# Patient Record
Sex: Male | Born: 1975 | Race: Black or African American | Hispanic: No | Marital: Married | Smoking: Former smoker
Health system: Southern US, Community
[De-identification: ages and names within clinical notes are randomized; demographics above are authoritative.]

## PROBLEM LIST (undated history)

## (undated) DIAGNOSIS — E559 Vitamin D deficiency, unspecified: Secondary | ICD-10-CM

## (undated) DIAGNOSIS — J302 Other seasonal allergic rhinitis: Secondary | ICD-10-CM

## (undated) DIAGNOSIS — R7303 Prediabetes: Secondary | ICD-10-CM

## (undated) DIAGNOSIS — K5010 Crohn's disease of large intestine without complications: Secondary | ICD-10-CM

## (undated) HISTORY — DX: Vitamin D deficiency, unspecified: E55.9

## (undated) HISTORY — PX: NO PAST SURGERIES: SHX2092

## (undated) HISTORY — DX: Crohn's disease of large intestine without complications: K50.10

## (undated) HISTORY — DX: Other seasonal allergic rhinitis: J30.2

## (undated) HISTORY — DX: Prediabetes: R73.03

---

## 2000-10-11 ENCOUNTER — Encounter: Admission: RE | Admit: 2000-10-11 | Discharge: 2000-10-11 | Payer: Self-pay | Admitting: Internal Medicine

## 2000-10-11 ENCOUNTER — Encounter: Payer: Self-pay | Admitting: Internal Medicine

## 2008-07-07 ENCOUNTER — Other Ambulatory Visit (HOSPITAL_COMMUNITY): Admission: RE | Admit: 2008-07-07 | Discharge: 2008-07-23 | Payer: Self-pay | Admitting: Psychiatry

## 2008-07-09 ENCOUNTER — Ambulatory Visit: Payer: Self-pay | Admitting: Psychiatry

## 2013-07-18 DIAGNOSIS — E559 Vitamin D deficiency, unspecified: Secondary | ICD-10-CM | POA: Insufficient documentation

## 2013-07-18 DIAGNOSIS — R7303 Prediabetes: Secondary | ICD-10-CM | POA: Insufficient documentation

## 2013-07-22 ENCOUNTER — Encounter: Payer: Self-pay | Admitting: Internal Medicine

## 2013-08-28 ENCOUNTER — Ambulatory Visit (INDEPENDENT_AMBULATORY_CARE_PROVIDER_SITE_OTHER): Payer: Managed Care, Other (non HMO) | Admitting: Internal Medicine

## 2013-08-28 ENCOUNTER — Encounter: Payer: Self-pay | Admitting: Internal Medicine

## 2013-08-28 VITALS — BP 118/80 | HR 72 | Temp 98.1°F | Resp 18 | Ht 69.5 in | Wt 171.0 lb

## 2013-08-28 DIAGNOSIS — Z0000 Encounter for general adult medical examination without abnormal findings: Secondary | ICD-10-CM

## 2013-08-28 DIAGNOSIS — E349 Endocrine disorder, unspecified: Secondary | ICD-10-CM | POA: Insufficient documentation

## 2013-08-28 DIAGNOSIS — E782 Mixed hyperlipidemia: Secondary | ICD-10-CM | POA: Insufficient documentation

## 2013-08-28 DIAGNOSIS — Z111 Encounter for screening for respiratory tuberculosis: Secondary | ICD-10-CM

## 2013-08-28 DIAGNOSIS — E291 Testicular hypofunction: Secondary | ICD-10-CM

## 2013-08-28 DIAGNOSIS — Z1212 Encounter for screening for malignant neoplasm of rectum: Secondary | ICD-10-CM

## 2013-08-28 DIAGNOSIS — E559 Vitamin D deficiency, unspecified: Secondary | ICD-10-CM

## 2013-08-28 NOTE — Patient Instructions (Signed)

## 2013-08-28 NOTE — Progress Notes (Signed)
Patient ID: John Velez, male   DOB: May 06, 1976, 38 y.o.   MRN: [id number]   Annual Screening Comprehensive Examination   This very nice 38 y.o. SBM presents for complete physical.  Patient has no major health issues. He has felt well with Systems review negative as below. In Feb 2014 his testosterone did test low at 304. He also has PreDiabetes with an elevated A1c 6.1% in Jan 2011 and subsequent AS1c was 5.3% in Jan 2013 and 5.9% in Feb 2014. Unfortunately he has gained 12# weight since last year. Finally, patient has history of Vitamin D Deficiency of 26 in 2010 and  last vitamin D was 11 in Feb 2014. In Feb he also had an elevated LDL chol of 108 with nl total chol 188, Trig 65 and HDL of 39.  Medication Sig  . Cholecalciferol (VITAMIN D PO) Take 8,000 Int'l Units by mouth daily.   No Known Allergies  Past Medical History  Diagnosis Date  . Vitamin D deficiency    Elevated LDL Cholesterol   . Prediabetes     Past Surgical History  Procedure Laterality Date  . No past surgeries      Family History  Problem Relation Age of Onset  . Diabetes Mother   . Cancer Mother     Breast  . Hypertension Father   . Hyperlipidemia Father   . Cancer Father     Prostate  . Cancer Maternal Grandmother     lung  . Cancer Maternal Grandfather     lung    History   Social History  . Marital Status: Married    Spouse Name: N/A    Number of Children: Daughter 68 mo & son DUE in 2 weeks  . Years of Education: N/A   Occupational History  . Works at [employer]  . Smoking status: Never Smoker   . Smokeless tobacco: Not on file  . Alcohol Use: Yes     Comment: ocassional  . Drug Use: No  . Sexual Activity: Active     ROS Constitutional: Denies fever, chills, weight loss/gain, headaches, insomnia, fatigue, night sweats, and change in appetite. Eyes: Denies redness, blurred vision, diplopia, discharge, itchy, watery eyes.  ENT: Denies discharge, congestion,  post nasal drip, epistaxis, sore throat, earache, hearing loss, dental pain, Tinnitus, Vertigo, Sinus pain, snoring.  Cardio: Denies chest pain, palpitations, irregular heartbeat, syncope, dyspnea, diaphoresis, orthopnea, PND, claudication, edema Respiratory: denies cough, dyspnea, DOE, pleurisy, hoarseness, laryngitis, wheezing.  Gastrointestinal: Denies dysphagia, heartburn, reflux, water brash, pain, cramps, nausea, vomiting, bloating, diarrhea, constipation, hematemesis, melena, hematochezia, jaundice, hemorrhoids Genitourinary: Denies dysuria, frequency, urgency, nocturia, hesitancy, discharge, hematuria, flank pain Breast: Breast lumps, nipple discharge, bleeding.  Musculoskeletal: Denies arthralgia, myalgia, stiffness, Jt. Swelling, pain, limp, and strain/sprain. Skin: Denies pruritis, rash, hives, warts, acne, eczema, change in skin lesion Neuro: Weakness, tremor, incoordination, spasms, paresthesia, pain Psychiatric: Denies confusion, memory loss, sensory loss. Endocrine: Denies change in weight, skin, hair change, nocturia, and paresthesia, diabetic polys, visual blurring Heme/Lymph: No excessive bleeding, bruising, enlarged lymph nodes.   Physical Exam  BP 118/80  Pulse 72  Temp(Src) 98.1 F (36.7 C) (Temporal)  Resp 18  Ht 5' 9.5" (1.765 m)  Wt 171 lb (77.565 kg)  BMI 24.90 kg/m2  General Appearance: Well nourished, in no apparent distress. Eyes: PERRLA, EOMs, conjunctiva no swelling or erythema, normal fundi and vessels. Sinuses: No frontal/maxillary tenderness ENT/Mouth: EACs patent / TMs  nl. Nares clear without erythema, swelling,  mucoid exudates. Oral hygiene is good. No erythema, swelling, or exudate. Tongue normal, non-obstructing. Tonsils not swollen or erythematous. Hearing normal.  Neck: Supple, thyroid normal. No bruits, nodes or JVD. Respiratory: Respiratory effort normal.  BS equal and clear bilateral without rales, rhonci, wheezing or stridor. Cardio: Heart  sounds are normal with regular rate and rhythm and no murmurs, rubs or gallops. Peripheral pulses are normal and equal bilaterally without edema. No aortic or femoral bruits. Chest: symmetric with normal excursions and percussion. Breasts: Symmetric, without lumps, nipple discharge, retractions, or fibrocystic changes.  Abdomen: Flat, soft, with bowl sounds. Nontender, no guarding, rebound, hernias, masses, or organomegaly.  Lymphatics: Non tender without lymphadenopathy.  Genitourinary: No hernia. Testes unremarlable. Musculoskeletal: Full ROM all peripheral extremities, joint stability, 5/5 strength, and normal gait. Skin: Warm and dry without rashes, lesions, cyanosis, clubbing or  ecchymosis.  Neuro: Cranial nerves intact, reflexes equal bilaterally. Normal muscle tone, no cerebellar symptoms. Sensation intact.  Pysch: Awake and oriented X 3, normal affect, Insight and Judgment appropriate.   Assessment and Plan  1. Annual Screening Examination 2. PreDiabetes 3. Vitamin Deficiency  4. Testosterone Deficiency  Continue prudent diet as discussed, weight control, regular exercise, and medications. Routine screening labs and tests as requested with regular follow-up as recommended.

## 2013-09-01 ENCOUNTER — Other Ambulatory Visit: Payer: Self-pay | Admitting: Internal Medicine

## 2013-09-02 LAB — CBC WITH DIFFERENTIAL
Basophils Absolute: 0 10*3/uL (ref 0.0–0.2)
Basos: 1 %
EOS: 9 %
Eosinophils Absolute: 0.6 10*3/uL — ABNORMAL HIGH (ref 0.0–0.4)
HEMATOCRIT: 46.5 % (ref 37.5–51.0)
HEMOGLOBIN: 16.1 g/dL (ref 12.6–17.7)
IMMATURE GRANS (ABS): 0 10*3/uL (ref 0.0–0.1)
Immature Granulocytes: 0 %
Lymphocytes Absolute: 2.1 10*3/uL (ref 0.7–3.1)
Lymphs: 29 %
MCH: 29.7 pg (ref 26.6–33.0)
MCHC: 34.6 g/dL (ref 31.5–35.7)
MCV: 86 fL (ref 79–97)
MONOS ABS: 0.5 10*3/uL (ref 0.1–0.9)
Monocytes: 7 %
NEUTROS ABS: 3.8 10*3/uL (ref 1.4–7.0)
NEUTROS PCT: 54 %
Platelets: 302 10*3/uL (ref 150–379)
RBC: 5.43 x10E6/uL (ref 4.14–5.80)
RDW: 13.3 % (ref 12.3–15.4)
WBC: 7.1 10*3/uL (ref 3.4–10.8)

## 2013-09-02 LAB — VITAMIN B12: VITAMIN B 12: 394 pg/mL (ref 211–946)

## 2013-09-02 LAB — CMP12+7AC
ALK PHOS: 63 IU/L (ref 39–117)
ALT: 23 IU/L (ref 0–44)
AST: 24 IU/L (ref 0–40)
Albumin/Globulin Ratio: 1.9 (ref 1.1–2.5)
Albumin: 5 g/dL (ref 3.5–5.5)
BUN / CREAT RATIO: 9 (ref 8–19)
BUN: 10 mg/dL (ref 6–20)
Calcium: 10 mg/dL (ref 8.7–10.2)
Chloride: 101 mmol/L (ref 97–108)
Cholesterol, Total: 176 mg/dL (ref 100–199)
Creatinine, Ser: 1.07 mg/dL (ref 0.76–1.27)
GFR calc Af Amer: 103 mL/min/{1.73_m2} (ref >59)
GFR calc non Af Amer: 89 mL/min/{1.73_m2} (ref >59)
GGT: 23 IU/L (ref 0–65)
GLOBULIN, TOTAL: 2.6 g/dL (ref 1.5–4.5)
Glucose: 106 mg/dL — ABNORMAL HIGH (ref 65–99)
LDH: 168 IU/L (ref 121–224)
Osmolality Calc: 297 mOsmol/kg — ABNORMAL HIGH (ref 275–295)
POTASSIUM: 4.4 mmol/L (ref 3.5–5.2)
Phosphorus: 3.1 mg/dL (ref 2.5–4.5)
Sodium: 144 mmol/L (ref 134–144)
Total Bilirubin: 0.4 mg/dL (ref 0.0–1.2)
Total Protein: 7.6 g/dL (ref 6.0–8.5)
Triglycerides: 104 mg/dL (ref 0–149)
Uric Acid: 4.7 mg/dL (ref 3.7–8.6)

## 2013-09-02 LAB — HGB A1C W/O EAG: Hgb A1c MFr Bld: 5.8 % — ABNORMAL HIGH (ref 4.8–5.6)

## 2013-09-02 LAB — URINALYSIS, ROUTINE W REFLEX MICROSCOPIC
Bilirubin, UA: NEGATIVE
Glucose, UA: NEGATIVE
KETONES UA: NEGATIVE
Leukocytes, UA: NEGATIVE
NITRITE UA: NEGATIVE
Protein, UA: NEGATIVE
RBC UA: NEGATIVE
SPEC GRAV UA: 1.022 (ref 1.005–1.030)
UUROB: 0.2 mg/dL (ref 0.0–1.9)
pH, UA: 7 (ref 5.0–7.5)

## 2013-09-02 LAB — HEPATITIS C ANTIBODY: Hep C Virus Ab: <0.1 s/co ratio (ref 0.0–0.9)

## 2013-09-02 LAB — HEPATITIS A (PROF V)
Hep A IgM: NEGATIVE
Hep A Total Ab: NEGATIVE

## 2013-09-02 LAB — LIPID PANEL W/O CHOL/HDL RATIO
HDL: 38 mg/dL — ABNORMAL LOW (ref >39)
LDL Calculated: 117 mg/dL — ABNORMAL HIGH (ref 0–99)
VLDL Cholesterol Cal: 21 mg/dL (ref 5–40)

## 2013-09-02 LAB — HIV ANTIBODY (ROUTINE TESTING W REFLEX)
HIV 1/O/2 Abs-Index Value: <1 (ref <1.00)
HIV-1/HIV-2 Ab: NONREACTIVE

## 2013-09-02 LAB — HEPATITIS B E ANTIBODY: HEP B E AB: NEGATIVE

## 2013-09-02 LAB — RPR: RPR: NONREACTIVE

## 2013-09-02 LAB — TSH: TSH: 1.46 u[IU]/mL (ref 0.450–4.500)

## 2013-09-02 LAB — INSULIN, FASTING: INSULIN: 8.6 u[IU]/mL (ref 2.6–24.9)

## 2013-09-02 LAB — HEPATITIS B CORE ANTIBODY, TOTAL: Hep B Core Total Ab: NEGATIVE

## 2013-09-02 LAB — MICROALBUMIN, URINE: MICROALBUM., U, RANDOM: 3.3 ug/mL (ref 0.0–17.0)

## 2013-09-02 LAB — MAGNESIUM: Magnesium: 2.1 mg/dL (ref 1.6–2.6)

## 2013-09-03 LAB — TB SKIN TEST
Induration: 0 mm
TB SKIN TEST: NEGATIVE

## 2013-09-10 ENCOUNTER — Encounter: Payer: Self-pay | Admitting: Internal Medicine

## 2013-09-22 ENCOUNTER — Ambulatory Visit (INDEPENDENT_AMBULATORY_CARE_PROVIDER_SITE_OTHER): Payer: Managed Care, Other (non HMO) | Admitting: Internal Medicine

## 2013-09-22 ENCOUNTER — Other Ambulatory Visit: Payer: Self-pay | Admitting: Internal Medicine

## 2013-09-22 ENCOUNTER — Encounter: Payer: Self-pay | Admitting: Internal Medicine

## 2013-09-22 ENCOUNTER — Other Ambulatory Visit (INDEPENDENT_AMBULATORY_CARE_PROVIDER_SITE_OTHER): Payer: Managed Care, Other (non HMO) | Admitting: *Deleted

## 2013-09-22 VITALS — BP 114/66 | HR 72 | Temp 98.1°F | Resp 16 | Ht 69.5 in | Wt 167.4 lb

## 2013-09-22 DIAGNOSIS — Z1212 Encounter for screening for malignant neoplasm of rectum: Secondary | ICD-10-CM

## 2013-09-22 DIAGNOSIS — L259 Unspecified contact dermatitis, unspecified cause: Secondary | ICD-10-CM

## 2013-09-22 DIAGNOSIS — L309 Dermatitis, unspecified: Secondary | ICD-10-CM

## 2013-09-22 DIAGNOSIS — D485 Neoplasm of uncertain behavior of skin: Secondary | ICD-10-CM

## 2013-09-22 LAB — POC HEMOCCULT BLD/STL (HOME/3-CARD/SCREEN)
Card #2 Fecal Occult Blod, POC: NEGATIVE
FECAL OCCULT BLD: NEGATIVE
FECAL OCCULT BLD: NEGATIVE

## 2013-09-22 MED ORDER — TRIAMCINOLONE ACETONIDE 0.1 % EX CREA: 1.0000 "application " | TOPICAL_CREAM | Freq: Two times a day (BID) | CUTANEOUS | Status: DC

## 2013-09-22 NOTE — Progress Notes (Signed)
   Subjective:    Patient ID: John Velez, male    DOB: Apr 19, 1976, 38 y.o.   MRN: [id number]  HPI Ver nice 38 yo MBM presents for evaluation of 2 problems . First is recurrent rash of Rt hand for which he's tried various OTC creams w/o much success. The 2sd is a pigmented lesion of the mid right upper back which may have changed recently.  Medication Sig Dispense Refill  . Cholecalciferol (VITAMIN D PO) Take 8,000 Int'l Units by mouth daily.       No Known Allergies  Past Medical History  Diagnosis Date  . Vitamin D deficiency   . Prediabetes    Review of Systems non contributatory  Objective:   Physical Exam  Skin focused exam reveals an eczematoid type rash over the palm of the right hand w/o sings of cellulitis, vesiculations or ulcerations.    Also noted - an 8 x 10 mm med-dark brown raised sl irregular lesion of the Rt upper back. With informed consent and after prep with isopropyl alcohol and after  local anesthesia with 0.3 cc of Marcaine & 1% xylocaine, the lesion was excised by shave excisional technique with a # 11 blade and prepped to send for pathology. Thereafter the biopsy site was deeply hyfrecated to fulgurate any remnant lesion fragments. Antibiotic ointment and sterile bandage was applied  Assessment & Plan:   1. Eczema of hand  - triamcinolone cream (KENALOG) 0.1 %; Apply 1 application topically 2 (two) times daily.  Dispense: 454 g; Refill: 0  2. Neoplasm of uncertain behavior of skin  - Lesion sent for Dermatology pathology. - Patient instructed in local wound care.

## 2013-09-25 LAB — PATHOLOGY REPORT

## 2014-07-06 ENCOUNTER — Encounter: Payer: Self-pay | Admitting: Internal Medicine

## 2014-07-06 ENCOUNTER — Ambulatory Visit (INDEPENDENT_AMBULATORY_CARE_PROVIDER_SITE_OTHER): Payer: Managed Care, Other (non HMO) | Admitting: Internal Medicine

## 2014-07-06 VITALS — BP 120/76 | HR 72 | Temp 98.1°F | Resp 16 | Ht 69.0 in | Wt 170.4 lb

## 2014-07-06 DIAGNOSIS — E349 Endocrine disorder, unspecified: Secondary | ICD-10-CM

## 2014-07-06 DIAGNOSIS — R5383 Other fatigue: Secondary | ICD-10-CM

## 2014-07-06 DIAGNOSIS — E291 Testicular hypofunction: Secondary | ICD-10-CM

## 2014-07-06 DIAGNOSIS — E782 Mixed hyperlipidemia: Secondary | ICD-10-CM

## 2014-07-06 DIAGNOSIS — Z111 Encounter for screening for respiratory tuberculosis: Secondary | ICD-10-CM

## 2014-07-06 DIAGNOSIS — E559 Vitamin D deficiency, unspecified: Secondary | ICD-10-CM

## 2014-07-06 DIAGNOSIS — R7309 Other abnormal glucose: Secondary | ICD-10-CM

## 2014-07-06 DIAGNOSIS — I10 Essential (primary) hypertension: Secondary | ICD-10-CM

## 2014-07-06 DIAGNOSIS — R0989 Other specified symptoms and signs involving the circulatory and respiratory systems: Secondary | ICD-10-CM | POA: Insufficient documentation

## 2014-07-06 DIAGNOSIS — Z1212 Encounter for screening for malignant neoplasm of rectum: Secondary | ICD-10-CM

## 2014-07-06 DIAGNOSIS — R7303 Prediabetes: Secondary | ICD-10-CM

## 2014-07-06 DIAGNOSIS — R030 Elevated blood-pressure reading, without diagnosis of hypertension: Secondary | ICD-10-CM

## 2014-07-06 DIAGNOSIS — Z79899 Other long term (current) drug therapy: Secondary | ICD-10-CM

## 2014-07-06 DIAGNOSIS — IMO0001 Reserved for inherently not codable concepts without codable children: Secondary | ICD-10-CM

## 2014-07-06 LAB — CBC WITH DIFFERENTIAL/PLATELET
BASOS ABS: 0.1 10*3/uL (ref 0.0–0.1)
BASOS PCT: 1 % (ref 0–1)
EOS PCT: 6 % — AB (ref 0–5)
Eosinophils Absolute: 0.5 10*3/uL (ref 0.0–0.7)
HEMATOCRIT: 46.5 % (ref 39.0–52.0)
Hemoglobin: 15.6 g/dL (ref 13.0–17.0)
LYMPHS PCT: 31 % (ref 12–46)
Lymphs Abs: 2.4 10*3/uL (ref 0.7–4.0)
MCH: 28.9 pg (ref 26.0–34.0)
MCHC: 33.5 g/dL (ref 30.0–36.0)
MCV: 86.3 fL (ref 78.0–100.0)
MPV: 8.5 fL — AB (ref 8.6–12.4)
Monocytes Absolute: 0.5 10*3/uL (ref 0.1–1.0)
Monocytes Relative: 7 % (ref 3–12)
Neutro Abs: 4.2 10*3/uL (ref 1.7–7.7)
Neutrophils Relative %: 55 % (ref 43–77)
PLATELETS: 281 10*3/uL (ref 150–400)
RBC: 5.39 MIL/uL (ref 4.22–5.81)
RDW: 13.4 % (ref 11.5–15.5)
WBC: 7.6 10*3/uL (ref 4.0–10.5)

## 2014-07-06 LAB — HEMOGLOBIN A1C
HEMOGLOBIN A1C: 5.7 % — AB (ref <5.7)
Mean Plasma Glucose: 117 mg/dL — ABNORMAL HIGH (ref <117)

## 2014-07-06 NOTE — Progress Notes (Signed)
Patient ID: John Velez, male   DOB: Dec 15, 1975, 39 y.o.   MRN: [id number] Annual Comprehensive Examination  This very nice 39 y.o.male presents for complete physical.  Patient has been followed to screen for elevated BP,   Prediabetes, Hyperlipidemia, and Vitamin D Deficiency.   Patient is screened expectantly for elevated BP. Patient's BP has been controlled at home.Today's BP: 120/76 mmHg. Patient denies any cardiac symptoms as chest pain, palpitations, shortness of breath, dizziness or ankle swelling.   Patient's hyperlipidemia is controlled with diet and medications. Patient denies myalgias or other medication SE's. Last lipids were not at goal HDL  38; LDL 117; Trig 104 on 09/01/2013.   Patient has prediabetes since Jan 29011 with A1c 6.1% and a1c was 5.6% in Jan 2013 and then 5.7% in Aug 2013.   Patient denies reactive hypoglycemic symptoms, visual blurring, diabetic polys or paresthesias. Last A1c was 5.8% on 09/01/2013.   Does have hx/o low Testosterone 304 in the past. Finally, patient has history of Vitamin D Deficiency of 26 in 2010 and last vitamin D was 83 in Feb 2014.   Medication Sig  . Cholecalciferol (VITAMIN D PO) Take 8,000 Int'l Units by mouth daily.  Marland Kitchen loratadine (CLARITIN) 10 MG tablet Take 10 mg by mouth daily. OTC  . triamcinolone cream (KENALOG) 0.1 % Apply 1 application topically 2 (two) times daily.   No Known Allergies   Past Medical History  Diagnosis Date  . Vitamin D deficiency    Hyperlipidemia   . Prediabetes    Health Maintenance  Topic Date Due  . INFLUENZA VACCINE  01/09/2014  . TETANUS/TDAP  06/12/2015   Immunization History  Administered Date(s) Administered  . PPD Test 08/28/2013, 07/06/2014  . Td 06/11/2005   Past Surgical History  Procedure Laterality Date  . No past surgeries     Family History  Problem Relation Age of Onset  . Diabetes Mother   . Cancer Mother     Breast  . Hypertension Father   . Hyperlipidemia Father   . Cancer  Father     Prostate  . Cancer Maternal Grandmother     lung  . Cancer Maternal Grandfather     lung   History   Social History  . Marital Status: Single    Spouse Name: N/A    Number of Children: N/A  . Years of Education: N/A   Occupational History  . 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   Social History Main Topics  . Smoking status: Never Smoker   . Smokeless tobacco: Not on file  . Alcohol Use: Yes     Comment: ocassional  . Drug Use: No  . Sexual Activity: Active      ROS Constitutional: Denies fever, chills, weight loss/gain, headaches, insomnia,  night sweats or change in appetite.does report occasional fatigue.fatigue. Eyes: Denies redness, blurred vision, diplopia, discharge, itchy or watery eyes.  ENT: Denies discharge, congestion, post nasal drip, epistaxis, sore throat, earache, hearing loss, dental pain, Tinnitus, Vertigo, Sinus pain or snoring.  Cardio: Denies chest pain, palpitations, irregular heartbeat, syncope, dyspnea, diaphoresis, orthopnea, PND, claudication or edema Respiratory: denies cough, dyspnea, DOE, pleurisy, hoarseness, laryngitis or wheezing.  Gastrointestinal: Denies dysphagia, heartburn, reflux, water brash, pain, cramps, nausea, vomiting, bloating, diarrhea, constipation, hematemesis, melena, hematochezia, jaundice or hemorrhoids Genitourinary: Denies dysuria, frequency, urgency, nocturia, hesitancy, discharge, hematuria or flank pain Musculoskeletal: Denies arthralgia, myalgia, stiffness, Jt. Swelling, pain, limp or strain/sprain. Denies Falls. Skin:  Denies puritis, rash, hives, warts, acne, eczema or change in skin lesion Neuro: No weakness, tremor, incoordination, spasms, paresthesia or pain Psychiatric: Denies confusion, memory loss or sensory loss. Denies Depression. Endocrine: Denies change in weight, skin, hair change, nocturia, and paresthesia, diabetic polys, visual blurring or hyper / hypo  glycemic episodes.  Heme/Lymph: No excessive bleeding, bruising or enlarged lymph nodes.  Physical Exam  BP 120/76 mmHg  Pulse 72  Temp(Src) 98.1 F (36.7 C)  Resp 16  Ht 5' 9"  (1.753 m)  Wt 170 lb 6.4 oz (77.293 kg)  BMI 25.15 kg/m2  General Appearance: Well nourished, in no apparent distress. Eyes: PERRLA, EOMs, conjunctiva no swelling or erythema, normal fundi and vessels. Sinuses: No frontal/maxillary tenderness ENT/Mouth: EACs patent / TMs  nl. Nares clear without erythema, swelling, mucoid exudates. Oral hygiene is good. No erythema, swelling, or exudate. Tongue normal, non-obstructing. Tonsils not swollen or erythematous. Hearing normal.  Neck: Supple, thyroid normal. No bruits, nodes or JVD. Respiratory: Respiratory effort normal.  BS equal and clear bilateral without rales, rhonci, wheezing or stridor. Cardio: Heart sounds are normal with regular rate and rhythm and no murmurs, rubs or gallops. Peripheral pulses are normal and equal bilaterally without edema. No aortic or femoral bruits. Chest: symmetric with normal excursions and percussion.  Abdomen: Flat, soft, with bowl sounds. Nontender, no guarding, rebound, hernias, masses, or organomegaly.  GU: No hernia. Testes WNL. Lymphatics: Non tender without lymphadenopathy.  Genitourinary: No hernias.Testes nl. DRE - prostate nl for age - smooth & firm w/o nodules. Musculoskeletal: Full ROM all peripheral extremities, joint stability, 5/5 strength, and normal gait. Skin: Warm and dry without rashes, lesions, cyanosis, clubbing or  ecchymosis.  Neuro: Cranial nerves intact, reflexes equal bilaterally. Normal muscle tone, no cerebellar symptoms. Sensation intact.  Pysch: Awake and oriented X 3 with normal affect, insight and judgment appropriate.   Assessment and Plan  1. Elevated BP, screening  - Microalbumin / creatinine urine ratio  2. Hyperlipidemia (LDL Chol)  - Lipid panel  3. Prediabetes  - Hemoglobin A1c -  Insulin, fasting  4. Vitamin D deficiency  - Vit D  25 hydroxy (rtn osteoporosis monitoring)  5. Testosterone deficiency   6. Other fatigue  - Vitamin B12 - Iron and TIBC - TSH - Testosterone  7. Screening for rectal cancer  - POC Hemoccult Bld/Stl (3-Cd Home Screen); Future  8. Medication management  - Urine Microscopic - CBC with Differential/Platelet - BASIC METABOLIC PANEL WITH GFR - Hepatic function panel - Magnesium  9. Screening examination for pulmonary tuberculosis  - PPD   Continue prudent diet as discussed, weight control, BP monitoring, regular exercise, and medications as discussed.  Discussed med effects and SE's. Routine screening labs and tests as requested with regular follow-up as recommended.

## 2014-07-06 NOTE — Patient Instructions (Addendum)
Recommend the book "The END of DIETING" by Dr Excell Seltzer   & the book "The END of DIABETES " by Dr Excell Seltzer  At [email or web address] - get book & Audio CD's      Being diabetic has a  300% increased risk for heart attack, stroke, cancer, and alzheimer- type vascular dementia. It is very important that you work harder with diet by avoiding all foods that are white except chicken & fish. Avoid white rice (brown & wild rice is OK), white potatoes (sweetpotatoes in moderation is OK), White bread or wheat bread or anything made out of white flour like bagels, donuts, rolls, buns, biscuits, cakes, pastries, cookies, pizza crust, and pasta (made from white flour & egg whites) - vegetarian pasta or spinach or wheat pasta is OK. Multigrain breads like Arnold's or [employer], or multigrain sandwich thins or flatbreads.  Diet, exercise and weight loss can reverse and cure diabetes in the early stages.  Diet, exercise and weight loss is very important in the control and prevention of complications of diabetes which affects every system in your body, ie. Brain - dementia/stroke, eyes - glaucoma/blindness, heart - heart attack/heart failure, kidneys - dialysis, stomach - gastric paralysis, intestines - malabsorption, nerves - severe painful neuritis, circulation - gangrene & loss of a leg(s), and finally cancer and Alzheimers.    I recommend avoid fried & greasy foods,  sweets/candy, white rice (brown or wild rice or Quinoa is OK), white potatoes (sweet potatoes are OK) - anything made from white flour - bagels, doughnuts, rolls, buns, biscuits,white and wheat breads, pizza crust and traditional pasta made of white flour & egg white(vegetarian pasta or spinach or wheat pasta is OK).  Multi-grain bread is OK - like multi-grain flat bread or sandwich thins. Avoid alcohol in excess. Exercise is also important.    Eat all the vegetables you want - avoid meat, especially red meat and dairy - especially cheese.  Cheese  is the most concentrated form of trans-fats which is the worst thing to clog up our arteries. Veggie cheese is OK which can be found in the fresh produce section at [employer] or [employer] or [employer]  Preventive Care for Adults A healthy lifestyle and preventive care can promote health and wellness. Preventive health guidelines for men include the following key practices:  A routine yearly physical is a good way to check with your health care provider about your health and preventative screening. It is a chance to share any concerns and updates on your health and to receive a thorough exam.  Visit your dentist for a routine exam and preventative care every 6 months. Brush your teeth twice a day and floss once a day. Good oral hygiene prevents tooth decay and gum disease.  The frequency of eye exams is based on your age, health, family medical history, use of contact lenses, and other factors. Follow your health care provider's recommendations for frequency of eye exams.  Eat a healthy diet. Foods such as vegetables, fruits, whole grains, low-fat dairy products, and lean protein foods contain the nutrients you need without too many calories. Decrease your intake of foods high in solid fats, added sugars, and salt. Eat the right amount of calories for you.Get information about a proper diet from your health care provider, if necessary.  Regular physical exercise is one of the most important things you can do for your health. Most adults should get at least 150 minutes of moderate-intensity exercise (any activity that  increases your heart rate and causes you to sweat) each week. In addition, most adults need muscle-strengthening exercises on 2 or more days a week.  Maintain a healthy weight. The body mass index (BMI) is a screening tool to identify possible weight problems. It provides an estimate of body fat based on height and weight. Your health care provider can find your BMI and can help you  achieve or maintain a healthy weight.For adults 20 years and older:  A BMI below 18.5 is considered underweight.  A BMI of 18.5 to 24.9 is normal.  A BMI of 25 to 29.9 is considered overweight.  A BMI of 30 and above is considered obese.  Maintain normal blood lipids and cholesterol levels by exercising and minimizing your intake of saturated fat. Eat a balanced diet with plenty of fruit and vegetables. Blood tests for lipids and cholesterol should begin at age 20 and be repeated every 5 years. If your lipid or cholesterol levels are high, you are over 50, or you are at high risk for heart disease, you may need your cholesterol levels checked more frequently.Ongoing high lipid and cholesterol levels should be treated with medicines if diet and exercise are not working.  If you smoke, find out from your health care provider how to quit. If you do not use tobacco, do not start.  Lung cancer screening is recommended for adults aged 55-80 years who are at high risk for developing lung cancer because of a history of smoking. A yearly low-dose CT scan of the lungs is recommended for people who have at least a 30-pack-year history of smoking and are a current smoker or have quit within the past 15 years. A pack year of smoking is smoking an average of 1 pack of cigarettes a day for 1 year (for example: 1 pack a day for 30 years or 2 packs a day for 15 years). Yearly screening should continue until the smoker has stopped smoking for at least 15 years. Yearly screening should be stopped for people who develop a health problem that would prevent them from having lung cancer treatment.  If you choose to drink alcohol, do not have more than 2 drinks per day. One drink is considered to be 12 ounces (355 mL) of beer, 5 ounces (148 mL) of wine, or 1.5 ounces (44 mL) of liquor.  High blood pressure causes heart disease and increases the risk of stroke. Your blood pressure should be checked. Ongoing high blood  pressure should be treated with medicines, if weight loss and exercise are not effective.  If you are 45-79 years old, ask your health care provider if you should take aspirin to prevent heart disease.  Diabetes screening involves taking a blood sample to check your fasting blood sugar level. Testing should be considered at a younger age or be carried out more frequently if you are overweight and have at least 1 risk factor for diabetes.  Colorectal cancer can be detected and often prevented. Most routine colorectal cancer screening begins at the age of 50 and continues through age 75. However, your health care provider may recommend screening at an earlier age if you have risk factors for colon cancer. On a yearly basis, your health care provider may provide home test kits to check for hidden blood in the stool. Use of a small camera at the end of a tube to directly examine the colon (sigmoidoscopy or colonoscopy) can detect the earliest forms of colorectal cancer. Talk to your   health care provider about this at age 35, when routine screening begins. Direct exam of the colon should be repeated every 5-10 years through age 23, unless early forms of precancerous polyps or small growths are found.  Screening for abdominal aortic aneurysm (AAA)  are recommended for persons over age 77 who have history of hypertensionor who are current or former smokers.  Talk with your health care provider about prostate cancer screening.  Testicular cancer screening is recommended for adult males. Screening includes self-exam, a health care provider exam, and other screening tests. Consult with your health care provider about any symptoms you have or any concerns you have about testicular cancer.  Use sunscreen. Apply sunscreen liberally and repeatedly throughout the day. You should seek shade when your shadow is shorter than you. Protect yourself by wearing long sleeves, pants, a wide-brimmed hat, and sunglasses year  round, whenever you are outdoors.  Once a month, do a whole-body skin exam, using a mirror to look at the skin on your back. Tell your health care provider about new moles, moles that have irregular borders, moles that are larger than a pencil eraser, or moles that have changed in shape or color.  Stay current with required vaccines (immunizations).  Influenza vaccine. All adults should be immunized every year.  Tetanus, diphtheria, and acellular pertussis (Td, Tdap) vaccine. An adult who has not previously received Tdap or who does not know his vaccine status should receive 1 dose of Tdap. This initial dose should be followed by tetanus and diphtheria toxoids (Td) booster doses every 10 years. Adults with an unknown or incomplete history of completing a 3-dose immunization series with Td-containing vaccines should begin or complete a primary immunization series including a Tdap dose. Adults should receive a Td booster every 10 years.  Zoster vaccine. One dose is recommended for adults aged 1 years or older unless certain conditions are present.    Pneumococcal 13-valent conjugate (PCV13) vaccine. When indicated, a person who is uncertain of his immunization history and has no record of immunization should receive the PCV13 vaccine. An adult aged 30 years or older who has certain medical conditions and has not been previously immunized should receive 1 dose of PCV13 vaccine. This PCV13 should be followed with a dose of pneumococcal polysaccharide (PPSV23) vaccine. The PPSV23 vaccine dose should be obtained at least 8 weeks after the dose of PCV13 vaccine. An adult aged 19 years or older who has certain medical conditions and previously received 1 or more doses of PPSV23 vaccine should receive 1 dose of PCV13. The PCV13 vaccine dose should be obtained 1 or more years after the last PPSV23 vaccine dose.    Pneumococcal polysaccharide (PPSV23) vaccine. When PCV13 is also indicated, PCV13 should be  obtained first. All adults aged 59 years and older should be immunized. An adult younger than age 7 years who has certain medical conditions should be immunized. Any person who resides in a nursing home or long-term care facility should be immunized. An adult smoker should be immunized. People with an immunocompromised condition and certain other conditions should receive both PCV13 and PPSV23 vaccines. People with human immunodeficiency virus (HIV) infection should be immunized as soon as possible after diagnosis. Immunization during chemotherapy or radiation therapy should be avoided. Routine use of PPSV23 vaccine is not recommended for American Indians, [place], or people younger than 65 years unless there are medical conditions that require PPSV23 vaccine. When indicated, people who have unknown immunization and have no record  of immunization should receive PPSV23 vaccine. One-time revaccination 5 years after the first dose of PPSV23 is recommended for people aged 19-64 years who have chronic kidney failure, nephrotic syndrome, asplenia, or immunocompromised conditions. People who received 1-2 doses of PPSV23 before age 65 years should receive another dose of PPSV23 vaccine at age 65 years or later if at least 5 years have passed since the previous dose. Doses of PPSV23 are not needed for people immunized with PPSV23 at or after age 65 years.  Hepatitis A vaccine. Adults who wish to be protected from this disease, have certain high-risk conditions, work with hepatitis A-infected animals, work in hepatitis A research labs, or travel to or work in countries with a high rate of hepatitis A should be immunized. Adults who were previously unvaccinated and who anticipate close contact with an international adoptee during the first 60 days after arrival in the [place] from a country with a high rate of hepatitis A should be immunized.  Hepatitis B vaccine. Adults should be immunized if they wish to be  protected from this disease, have certain high-risk conditions, may be exposed to blood or other infectious body fluids, are household contacts or sex partners of hepatitis B positive people, are clients or workers in certain care facilities, or travel to or work in countries with a high rate of hepatitis B.  Preventive Service / Frequency  Ages 19 to 39  Blood pressure check.  Lipid and cholesterol check.  Hepatitis C blood test.** / For any individual with known risks for hepatitis C.  Skin self-exam. / Monthly.  Influenza vaccine. / Every year.  Tetanus, diphtheria, and acellular pertussis (Tdap, Td) vaccine.** / Consult your health care provider. 1 dose of Td every 10 years.  HPV vaccine. / 3 doses over 6 months, if 26 or younger.  Measles, mumps, rubella (MMR) vaccine.** / You need at least 1 dose of MMR if you were born in 1957 or later. You may also need a second dose.  Pneumococcal 13-valent conjugate (PCV13) vaccine.** / Consult your health care provider.  Pneumococcal polysaccharide (PPSV23) vaccine.** / 1 to 2 doses if you smoke cigarettes or if you have certain conditions.  Meningococcal vaccine.** / 1 dose if you are age 19 to 21 years and a first-year college student living in a residence hall, or have one of several medical conditions. You may also need additional booster doses.  Hepatitis A vaccine.** / Consult your health care provider.  Hepatitis B vaccine.** / Consult your health care provider. 

## 2014-07-07 LAB — LIPID PANEL
Cholesterol: 179 mg/dL (ref 0–200)
HDL: 39 mg/dL — ABNORMAL LOW (ref >39)
LDL CALC: 110 mg/dL — AB (ref 0–99)
TRIGLYCERIDES: 151 mg/dL — AB (ref <150)
Total CHOL/HDL Ratio: 4.6 Ratio
VLDL: 30 mg/dL (ref 0–40)

## 2014-07-07 LAB — HEPATIC FUNCTION PANEL
ALK PHOS: 62 U/L (ref 39–117)
ALT: 20 U/L (ref 0–53)
AST: 18 U/L (ref 0–37)
Albumin: 4.5 g/dL (ref 3.5–5.2)
BILIRUBIN DIRECT: 0.1 mg/dL (ref 0.0–0.3)
BILIRUBIN TOTAL: 0.5 mg/dL (ref 0.2–1.2)
Indirect Bilirubin: 0.4 mg/dL (ref 0.2–1.2)
Total Protein: 7 g/dL (ref 6.0–8.3)

## 2014-07-07 LAB — BASIC METABOLIC PANEL WITH GFR
BUN: 8 mg/dL (ref 6–23)
CALCIUM: 9.2 mg/dL (ref 8.4–10.5)
CHLORIDE: 103 meq/L (ref 96–112)
CO2: 28 meq/L (ref 19–32)
Creat: 0.9 mg/dL (ref 0.50–1.35)
GFR, Est African American: >89 mL/min
GFR, Est Non African American: >89 mL/min
Glucose, Bld: 97 mg/dL (ref 70–99)
Potassium: 4.2 mEq/L (ref 3.5–5.3)
SODIUM: 140 meq/L (ref 135–145)

## 2014-07-07 LAB — MICROALBUMIN / CREATININE URINE RATIO
Creatinine, Urine: 282.5 mg/dL
MICROALB/CREAT RATIO: 2.5 mg/g (ref 0.0–30.0)
Microalb, Ur: 0.7 mg/dL (ref <2.0)

## 2014-07-07 LAB — IRON AND TIBC
%SAT: 39 % (ref 20–55)
IRON: 110 ug/dL (ref 42–165)
TIBC: 280 ug/dL (ref 215–435)
UIBC: 170 ug/dL (ref 125–400)

## 2014-07-07 LAB — TESTOSTERONE: Testosterone: 222 ng/dL — ABNORMAL LOW (ref 300–890)

## 2014-07-07 LAB — TSH: TSH: 1.158 u[IU]/mL (ref 0.350–4.500)

## 2014-07-07 LAB — URINALYSIS, MICROSCOPIC ONLY
BACTERIA UA: NONE SEEN
CASTS: NONE SEEN
CRYSTALS: NONE SEEN
Squamous Epithelial / LPF: NONE SEEN

## 2014-07-07 LAB — VITAMIN D 25 HYDROXY (VIT D DEFICIENCY, FRACTURES): VIT D 25 HYDROXY: 45 ng/mL (ref 30–100)

## 2014-07-07 LAB — VITAMIN B12: VITAMIN B 12: 289 pg/mL (ref 211–911)

## 2014-07-07 LAB — MAGNESIUM: Magnesium: 1.9 mg/dL (ref 1.5–2.5)

## 2014-07-07 LAB — INSULIN, FASTING: INSULIN FASTING, SERUM: 18 u[IU]/mL (ref 2.0–19.6)

## 2014-07-23 ENCOUNTER — Encounter: Payer: Self-pay | Admitting: Internal Medicine

## 2014-07-24 NOTE — Addendum Note (Signed)
Addended by: Amaiyah Nordhoff A on: 07/24/2014 09:39 AM   Modules accepted: Orders

## 2014-09-02 ENCOUNTER — Encounter: Payer: Self-pay | Admitting: Internal Medicine

## 2015-01-20 ENCOUNTER — Encounter: Payer: Self-pay | Admitting: Internal Medicine

## 2015-01-20 ENCOUNTER — Ambulatory Visit (INDEPENDENT_AMBULATORY_CARE_PROVIDER_SITE_OTHER): Payer: Managed Care, Other (non HMO) | Admitting: Internal Medicine

## 2015-01-20 VITALS — BP 122/78 | HR 84 | Temp 97.5°F | Resp 16 | Ht 69.0 in | Wt 165.8 lb

## 2015-01-20 DIAGNOSIS — E559 Vitamin D deficiency, unspecified: Secondary | ICD-10-CM

## 2015-01-20 DIAGNOSIS — E291 Testicular hypofunction: Secondary | ICD-10-CM

## 2015-01-20 DIAGNOSIS — E782 Mixed hyperlipidemia: Secondary | ICD-10-CM | POA: Diagnosis not present

## 2015-01-20 DIAGNOSIS — R7309 Other abnormal glucose: Secondary | ICD-10-CM | POA: Diagnosis not present

## 2015-01-20 DIAGNOSIS — Z79899 Other long term (current) drug therapy: Secondary | ICD-10-CM | POA: Diagnosis not present

## 2015-01-20 DIAGNOSIS — R030 Elevated blood-pressure reading, without diagnosis of hypertension: Secondary | ICD-10-CM | POA: Diagnosis not present

## 2015-01-20 DIAGNOSIS — E349 Endocrine disorder, unspecified: Secondary | ICD-10-CM

## 2015-01-20 DIAGNOSIS — R7303 Prediabetes: Secondary | ICD-10-CM

## 2015-01-20 DIAGNOSIS — IMO0001 Reserved for inherently not codable concepts without codable children: Secondary | ICD-10-CM

## 2015-01-20 LAB — HEPATIC FUNCTION PANEL
ALK PHOS: 53 U/L (ref 40–115)
ALT: 19 U/L (ref 9–46)
AST: 20 U/L (ref 10–40)
Albumin: 4.5 g/dL (ref 3.6–5.1)
BILIRUBIN DIRECT: 0.1 mg/dL (ref <=0.2)
BILIRUBIN TOTAL: 0.4 mg/dL (ref 0.2–1.2)
Indirect Bilirubin: 0.3 mg/dL (ref 0.2–1.2)
TOTAL PROTEIN: 7.2 g/dL (ref 6.1–8.1)

## 2015-01-20 LAB — LIPID PANEL
CHOLESTEROL: 149 mg/dL (ref 125–200)
HDL: 35 mg/dL — ABNORMAL LOW (ref >=40)
LDL CALC: 82 mg/dL (ref <130)
Total CHOL/HDL Ratio: 4.3 Ratio (ref <=5.0)
Triglycerides: 158 mg/dL — ABNORMAL HIGH (ref <150)
VLDL: 32 mg/dL — ABNORMAL HIGH (ref <30)

## 2015-01-20 LAB — BASIC METABOLIC PANEL WITH GFR
BUN: 10 mg/dL (ref 7–25)
CO2: 28 mmol/L (ref 20–31)
Calcium: 9.4 mg/dL (ref 8.6–10.3)
Chloride: 103 mmol/L (ref 98–110)
Creat: 0.97 mg/dL (ref 0.60–1.35)
Glucose, Bld: 97 mg/dL (ref 65–99)
POTASSIUM: 4.5 mmol/L (ref 3.5–5.3)
SODIUM: 139 mmol/L (ref 135–146)

## 2015-01-20 LAB — TSH: TSH: 1.344 u[IU]/mL (ref 0.350–4.500)

## 2015-01-20 LAB — VITAMIN D 25 HYDROXY (VIT D DEFICIENCY, FRACTURES): VIT D 25 HYDROXY: 44 ng/mL (ref 30–100)

## 2015-01-20 LAB — CBC WITH DIFFERENTIAL/PLATELET
Basophils Absolute: 0.1 10*3/uL (ref 0.0–0.1)
Basophils Relative: 1 % (ref 0–1)
EOS ABS: 0.4 10*3/uL (ref 0.0–0.7)
Eosinophils Relative: 5 % (ref 0–5)
HEMATOCRIT: 44.1 % (ref 39.0–52.0)
Hemoglobin: 15.3 g/dL (ref 13.0–17.0)
LYMPHS ABS: 2.1 10*3/uL (ref 0.7–4.0)
Lymphocytes Relative: 25 % (ref 12–46)
MCH: 29.5 pg (ref 26.0–34.0)
MCHC: 34.7 g/dL (ref 30.0–36.0)
MCV: 85 fL (ref 78.0–100.0)
MPV: 8.8 fL (ref 8.6–12.4)
Monocytes Absolute: 0.5 10*3/uL (ref 0.1–1.0)
Monocytes Relative: 6 % (ref 3–12)
Neutro Abs: 5.3 10*3/uL (ref 1.7–7.7)
Neutrophils Relative %: 63 % (ref 43–77)
PLATELETS: 303 10*3/uL (ref 150–400)
RBC: 5.19 MIL/uL (ref 4.22–5.81)
RDW: 12.9 % (ref 11.5–15.5)
WBC: 8.4 10*3/uL (ref 4.0–10.5)

## 2015-01-20 LAB — TESTOSTERONE: Testosterone: 185 ng/dL — ABNORMAL LOW (ref 300–890)

## 2015-01-20 LAB — MAGNESIUM: Magnesium: 2 mg/dL (ref 1.5–2.5)

## 2015-01-20 NOTE — Patient Instructions (Addendum)
A1c was 5.7% & elevated in the borderline and early or pre-diabetes range which has the same 300% increased risk for heart attack, stroke, cancer, and alzheimer- type vascular dementia as full blown diabetes. But the good news is that diet, exercise with weight loss can cure the early diabetes at this point. ++++++++++++++++++++++++++++++++++++++++++++++++ 9 Ways to Naturally Increase Testosterone Levels  1.   Lose Weight  If you're overweight, shedding the excess pounds may increase your testosterone levels, according to research presented at the Endocrine Society's 2012 meeting. Overweight men are more likely to have low testosterone levels to begin with, so this is an important trick to increase your body's testosterone production when you need it most.  2.   High-Intensity Exercise like Peak Fitness   Short intense exercise has a proven positive effect on increasing testosterone levels and preventing its decline. That's unlike aerobics or prolonged moderate exercise, which have shown to have negative or no effect on testosterone levels. Having a whey protein meal after exercise can further enhance the satiety/testosterone-boosting impact (hunger hormones cause the opposite effect on your testosterone and libido). Here's a summary of what a typical high-intensity Peak Fitness routine might look like: " Warm up for three minutes  " Exercise as hard and fast as you can for 30 seconds. You should feel like you couldn't possibly go on another few seconds  " Recover at a slow to moderate pace for 90 seconds  " Repeat the high intensity exercise and recovery 7 more times .  3.   Consume Plenty of Zinc  The mineral zinc is important for testosterone production, and supplementing your diet for as little as six weeks has been shown to cause a marked improvement in testosterone among men with low levels.1 Likewise, research has shown that restricting dietary sources of zinc leads to a significant  decrease in testosterone, while zinc supplementation increases it2 -- and even protects men from exercised-induced reductions in testosterone levels.3 It's estimated that up to 39 percent of adults over the age of 60 may have lower than recommended zinc intakes; even when dietary supplements were added in, an estimated 20-25 percent of older adults still had inadequate zinc intakes, according to a [employer] and Nutrition Examination Survey.4 Your diet is the best source of zinc; along with protein-rich foods like meats and fish, other good dietary sources of zinc include raw milk, raw cheese, beans, and yogurt or kefir made from raw milk. It can be difficult to obtain enough dietary zinc if you're a vegetarian, and also for meat-eaters as well, largely because of conventional farming methods that rely heavily on chemical fertilizers and pesticides. These chemicals deplete the soil of nutrients ... nutrients like zinc that must be absorbed by plants in order to be passed on to you. In many cases, you may further deplete the nutrients in your food by the way you prepare it. For most food, cooking it will drastically reduce its levels of nutrients like zinc . particularly over-cooking, which many people do. If you decide to use a zinc supplement, stick to a dosage of less than 40 mg a day, as this is the recommended adult upper limit. Taking too much zinc can interfere with your body's ability to absorb other minerals, especially copper, and may cause nausea as a side effect.  4.   Strength Training  In addition to Peak Fitness, strength training is also known to boost testosterone levels, provided you are doing so intensely enough. When strength training  to boost testosterone, you'll want to increase the weight and lower your number of reps, and then focus on exercises that work a large number of muscles, such as dead lifts or squats.  You can "turbo-charge" your weight training by going slower. By  slowing down your movement, you're actually turning it into a high-intensity exercise. Super Slow movement allows your muscle, at the microscopic level, to access the maximum number of cross-bridges between the protein filaments that produce movement in the muscle.   5.   Optimize Your Vitamin D Levels  Vitamin D, a steroid hormone, is essential for the healthy development of the nucleus of the sperm cell, and helps maintain semen quality and sperm count. Vitamin D also increases levels of testosterone, which may boost libido. In one study, overweight men who were given vitamin D supplements had a significant increase in testosterone levels after one year.5   6.   Reduce Stress  When you're under a lot of stress, your body releases high levels of the stress hormone cortisol. This hormone actually blocks the effects of testosterone,6 presumably because, from a biological standpoint, testosterone-associated behaviors (mating, competing, aggression) may have lowered your chances of survival in an emergency (hence, the "fight or flight" response is dominant, courtesy of cortisol).  7.   Limit or Eliminate Sugar from Your Diet  Testosterone levels decrease after you eat sugar, which is likely because the sugar leads to a high insulin level, another factor leading to low testosterone.7 Based on USDA estimates, the average American consumes 12 teaspoons of sugar a day, which equates to about TWO TONS of sugar during a lifetime.  8.   Eat Healthy Fats  By healthy, this means not only mono- and polyunsaturated fats, like that found in avocadoes and nuts, but also saturated, as these are essential for building testosterone. Research shows that a diet with less than 40 percent of energy as fat (and that mainly from animal sources, i.e. saturated) lead to a decrease in testosterone levels. ie eat less animal products - as Meat , poultry and dairy. Experts agree that the ideal diet includes somewhere between  50-70 percent fat.  It's important to understand that your body requires saturated fats from animal and vegetable sources (such as meat, dairy, certain oils, and tropical plants like coconut) for optimal functioning, and if you neglect this important food group in favor of sugar, grains and other starchy carbs, your health and weight are almost guaranteed to suffer. Examples of healthy fats you can eat more of to give your testosterone levels a boost include: Olives and Olive oil  Coconuts and coconut oil Butter made from raw grass-fed organic milk Raw nuts, such as, almonds or pecans Organic pastured egg yolks Avocados Grass-fed meats Palm oil Unheated organic nut oils   9.   Boost Your Intake of Branch Chain Amino Acids (BCAA) from Foods Like [place] suggests that BCAAs result in higher testosterone levels, particularly when taken along with resistance training. While BCAAs are available in supplement form, you'll find the highest concentrations of BCAAs like leucine in whey protein. Even when getting leucine from your natural food supply, it's often wasted or used as a building block instead of an anabolic agent. So to create the correct anabolic environment, you need to boost leucine consumption way beyond mere maintenance levels. That said, keep in mind that using leucine as a free form amino acid can be highly counterproductive as when free form amino acids are artificially administrated,  they rapidly enter your circulation while disrupting insulin function, and impairing your body's glycemic control. Food-based leucine is really the ideal form that can benefit your muscles without side effects.     ================================================  Recommend Adult Low dose Aspirin or coated  Aspirin 81 mg daily   To reduce risk of Colon Cancer 20 %,   Skin Cancer 26 % ,   Melanoma 46%   and   Pancreatic cancer 60% ++++++++++++++++++ Vitamin D goal is between 70-100.    Please make sure that you are taking your Vitamin D as directed.   It is very important as a natural anti-inflammatory   helping hair, skin, and nails, as well as reducing stroke and heart attack risk.   It helps your bones and helps with mood.  It also decreases numerous cancer risks so please take it as directed.   Low Vit D is associated with a 200-300% higher risk for CANCER   and 200-300% higher risk for HEART   ATTACK  &  STROKE.   .....................................Marland Kitchen  It is also associated with higher death rate at younger ages,   autoimmune diseases like Rheumatoid arthritis, Lupus, Multiple Sclerosis.     Also many other serious conditions, like depression, Alzheimer's  Dementia, infertility, muscle aches, fatigue, fibromyalgia - just to name a few.  +++++++++++++++++++  Recommend the book "The END of DIETING" by Dr Excell Seltzer   & the book "The END of DIABETES " by Dr Excell Seltzer  At [facility] [email or web address] - get book & Audio CD's     Being diabetic has a  300% increased risk for heart attack, stroke, cancer, and alzheimer- type vascular dementia. It is very important that you work harder with diet by avoiding all foods that are white. Avoid white rice (brown & wild rice is OK), white potatoes (sweetpotatoes in moderation is OK), White bread or wheat bread or anything made out of white flour like bagels, donuts, rolls, buns, biscuits, cakes, pastries, cookies, pizza crust, and pasta (made from white flour & egg whites) - vegetarian pasta or spinach or wheat pasta is OK. Multigrain breads like Arnold's or Pepper[employer], or multigrain sandwich thins or flatbreads.  Diet, exercise and weight loss can reverse and cure diabetes in the early stages.  Diet, exercise and weight loss is very important in the control and prevention of complications of diabetes which affects every system in your body, ie. Brain - dementia/stroke, eyes - glaucoma/blindness, heart - heart attack/heart  failure, kidneys - dialysis, stomach - gastric paralysis, intestines - malabsorption, nerves - severe painful neuritis, circulation - gangrene & loss of a leg(s), and finally cancer and Alzheimers.    I recommend avoid fried & greasy foods,  sweets/candy, white rice (brown or wild rice or Quinoa is OK), white potatoes (sweet potatoes are OK) - anything made from white flour - bagels, doughnuts, rolls, buns, biscuits,white and wheat breads, pizza crust and traditional pasta made of white flour & egg white(vegetarian pasta or spinach or wheat pasta is OK).  Multi-grain bread is OK - like multi-grain flat bread or sandwich thins. Avoid alcohol in excess. Exercise is also important.    Eat all the vegetables you want - avoid meat, especially red meat and dairy - especially cheese.  Cheese is the most concentrated form of trans-fats which is the worst thing to clog up our arteries. Veggie cheese is OK which can be found in the fresh produce section at [facility] or [employer] or [employer]  ++++++++++++++++++++++++++

## 2015-01-20 NOTE — Progress Notes (Signed)
Patient ID: John Velez, male   DOB: [birth year], 39 y.o.   MRN: [id number]   This very nice 39 y.o. MBM presents for 3 month follow up with Hypertension, Hyperlipidemia, Pre-Diabetes, Testosterone and Vitamin D Deficiency.    Patient is monitored expectantly for hx/o labile elevated BP. Today's BP: 122/78 mmHg. Patient has had no complaints of any cardiac type chest pain, palpitations, dyspnea/orthopnea/PND, dizziness, claudication, or dependent edema.   Hyperlipidemia is not controlled with diet.  Last Lipids were not at goal -  Cholesterol 179; HDL 39*; LDL 110; Triglycerides 151 on 07/06/2014.   Also, the patient has history of PreDiabetes and has had no symptoms of reactive hypoglycemia, diabetic polys, paresthesias or visual blurring.  Last A1c was 5.7% on 07/06/2014.    In January, patient had a low testosterone level of 222. 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 Further, the patient also has history of Vitamin D Deficiency and supplements vitamin D without any suspected side-effects. Last vitamin D was 45 on 07/06/2014.    Medication Sig  . Cholecalciferol (VITAMIN D PO) Take 8,000 Int'l Units by mouth daily.  Marland Kitchen triamcinolone cream (KENALOG) 0.1 % Apply 1 application topically 2 (two) times daily.   No Known Allergies  PMHx:   Past Medical History  Diagnosis Date  . Vitamin D deficiency   . Prediabetes    Immunization History  Administered Date(s) Administered  . PPD Test 08/28/2013, 07/06/2014  . Td 06/11/2005   Past Surgical History  Procedure Laterality Date  . No past surgeries     FHx:    Reviewed / unchanged  SHx:    Reviewed / unchanged  Systems Review:  Constitutional: Denies fever, chills, wt changes, headaches, insomnia, fatigue, night sweats, change in appetite. Eyes: Denies redness, blurred vision, diplopia, discharge, itchy, watery eyes.  ENT: Denies discharge, congestion, post nasal  drip, epistaxis, sore throat, earache, hearing loss, dental pain, tinnitus, vertigo, sinus pain, snoring.  CV: Denies chest pain, palpitations, irregular heartbeat, syncope, dyspnea, diaphoresis, orthopnea, PND, claudication or edema. Respiratory: denies cough, dyspnea, DOE, pleurisy, hoarseness, laryngitis, wheezing.  Gastrointestinal: Denies dysphagia, odynophagia, heartburn, reflux, water brash, abdominal pain or cramps, nausea, vomiting, bloating, diarrhea, constipation, hematemesis, melena, hematochezia  or hemorrhoids. Genitourinary: Denies dysuria, frequency, urgency, nocturia, hesitancy, discharge, hematuria or flank pain. Musculoskeletal: Denies arthralgias, myalgias, stiffness, jt. swelling, pain, limping or strain/sprain.  Skin: Denies pruritus, rash, hives, warts, acne, eczema or change in skin lesion(s). Neuro: No weakness, tremor, incoordination, spasms, paresthesia or pain. Psychiatric: Denies confusion, memory loss or sensory loss. Endo: Denies change in weight, skin or hair change.  Heme/Lymph: No excessive bleeding, bruising or enlarged lymph nodes.  Physical Exam  BP 122/78 mmHg  Pulse 84  Temp(Src) 97.5 F (36.4 C)  Resp 16  Ht 5' 9"  (1.753 m)  Wt 165 lb 12.8 oz (75.206 kg)  BMI 24.47 kg/m2  Appears well nourished and in no distress. Eyes: PERRLA, EOMs, conjunctiva no swelling or erythema. Sinuses: No frontal/maxillary tenderness ENT/Mouth: EAC's clear, TM's nl w/o erythema, bulging. Nares clear w/o erythema, swelling, exudates. Oropharynx clear without erythema or exudates. Oral hygiene is good. Tongue normal, non obstructing. Hearing intact.  Neck: Supple. Thyroid nl. Car 2+/2+ without bruits, nodes or JVD. Chest: Respirations nl with BS clear & equal w/o rales, rhonchi, wheezing or stridor.  Cor: Heart sounds normal w/ regular rate and rhythm without sig. murmurs, gallops, clicks, or rubs. Peripheral pulses normal  and equal  without edema.  Abdomen: Soft & bowel  sounds normal. Non-tender w/o guarding, rebound, hernias, masses, or organomegaly.  Lymphatics: Unremarkable.  Musculoskeletal: Full ROM all peripheral extremities, joint stability, 5/5 strength, and normal gait.  Skin: Warm, dry without exposed rashes, lesions or ecchymosis apparent.  Neuro: Cranial nerves intact, reflexes equal bilaterally. Sensory-motor testing grossly intact. Tendon reflexes grossly intact.  Pysch: Alert & oriented x 3.  Insight and judgement nl & appropriate. No ideations.  Assessment and Plan:  1. Elevated BP  - TSH  2. Hyperlipidemia (LDL Chol)  - Lipid panel  3. Prediabetes  - Hemoglobin A1c - Insulin, random  4. Vitamin D deficiency  - Vit D  25 hydroxy (rtn osteoporosis monitoring)  5. Testosterone deficiency  - Testosterone  6. Medication management  - CBC with Differential/Platelet - BASIC METABOLIC PANEL WITH GFR - Hepatic function panel - Magnesium   Recommended regular exercise, BP monitoring, weight control, and discussed med and SE's. Recommended labs to assess and monitor clinical status. Further disposition pending results of labs. Over 30 minutes of exam, counseling, chart review was performed

## 2015-01-21 LAB — HEMOGLOBIN A1C
HEMOGLOBIN A1C: 5.8 % — AB (ref <5.7)
MEAN PLASMA GLUCOSE: 120 mg/dL — AB (ref <117)

## 2015-01-21 LAB — INSULIN, RANDOM: Insulin: 25.6 u[IU]/mL — ABNORMAL HIGH (ref 2.0–19.6)

## 2015-07-14 ENCOUNTER — Encounter: Payer: Self-pay | Admitting: Internal Medicine

## 2015-07-14 ENCOUNTER — Ambulatory Visit (INDEPENDENT_AMBULATORY_CARE_PROVIDER_SITE_OTHER): Payer: Managed Care, Other (non HMO) | Admitting: Internal Medicine

## 2015-07-14 VITALS — BP 124/76 | HR 76 | Temp 97.3°F | Resp 16 | Ht 69.0 in | Wt 170.4 lb

## 2015-07-14 DIAGNOSIS — J069 Acute upper respiratory infection, unspecified: Secondary | ICD-10-CM

## 2015-07-14 DIAGNOSIS — E782 Mixed hyperlipidemia: Secondary | ICD-10-CM

## 2015-07-14 DIAGNOSIS — Z23 Encounter for immunization: Secondary | ICD-10-CM | POA: Diagnosis not present

## 2015-07-14 DIAGNOSIS — E559 Vitamin D deficiency, unspecified: Secondary | ICD-10-CM

## 2015-07-14 DIAGNOSIS — R030 Elevated blood-pressure reading, without diagnosis of hypertension: Secondary | ICD-10-CM

## 2015-07-14 DIAGNOSIS — Z0001 Encounter for general adult medical examination with abnormal findings: Secondary | ICD-10-CM

## 2015-07-14 DIAGNOSIS — Z79899 Other long term (current) drug therapy: Secondary | ICD-10-CM

## 2015-07-14 DIAGNOSIS — R7303 Prediabetes: Secondary | ICD-10-CM

## 2015-07-14 DIAGNOSIS — IMO0001 Reserved for inherently not codable concepts without codable children: Secondary | ICD-10-CM

## 2015-07-14 DIAGNOSIS — R5383 Other fatigue: Secondary | ICD-10-CM

## 2015-07-14 DIAGNOSIS — Z0000 Encounter for general adult medical examination without abnormal findings: Secondary | ICD-10-CM

## 2015-07-14 DIAGNOSIS — Z1212 Encounter for screening for malignant neoplasm of rectum: Secondary | ICD-10-CM

## 2015-07-14 DIAGNOSIS — E349 Endocrine disorder, unspecified: Secondary | ICD-10-CM

## 2015-07-14 LAB — CBC WITH DIFFERENTIAL/PLATELET
BASOS ABS: 0.1 10*3/uL (ref 0.0–0.1)
Basophils Relative: 1 % (ref 0–1)
EOS ABS: 0.8 10*3/uL — AB (ref 0.0–0.7)
EOS PCT: 8 % — AB (ref 0–5)
HCT: 45.1 % (ref 39.0–52.0)
Hemoglobin: 15.2 g/dL (ref 13.0–17.0)
LYMPHS ABS: 1.3 10*3/uL (ref 0.7–4.0)
Lymphocytes Relative: 13 % (ref 12–46)
MCH: 28.9 pg (ref 26.0–34.0)
MCHC: 33.7 g/dL (ref 30.0–36.0)
MCV: 85.7 fL (ref 78.0–100.0)
MONOS PCT: 10 % (ref 3–12)
MPV: 8.8 fL (ref 8.6–12.4)
Monocytes Absolute: 1 10*3/uL (ref 0.1–1.0)
Neutro Abs: 6.7 10*3/uL (ref 1.7–7.7)
Neutrophils Relative %: 68 % (ref 43–77)
PLATELETS: 279 10*3/uL (ref 150–400)
RBC: 5.26 MIL/uL (ref 4.22–5.81)
RDW: 13.4 % (ref 11.5–15.5)
WBC: 9.8 10*3/uL (ref 4.0–10.5)

## 2015-07-14 LAB — URINALYSIS, ROUTINE W REFLEX MICROSCOPIC
BILIRUBIN URINE: NEGATIVE
Glucose, UA: NEGATIVE
Hgb urine dipstick: NEGATIVE
Ketones, ur: NEGATIVE
Leukocytes, UA: NEGATIVE
Nitrite: NEGATIVE
PH: 7.5 (ref 5.0–8.0)
Protein, ur: NEGATIVE
SPECIFIC GRAVITY, URINE: 1.016 (ref 1.001–1.035)

## 2015-07-14 LAB — HEPATIC FUNCTION PANEL
ALBUMIN: 4.7 g/dL (ref 3.6–5.1)
ALK PHOS: 54 U/L (ref 40–115)
ALT: 27 U/L (ref 9–46)
AST: 25 U/L (ref 10–40)
BILIRUBIN INDIRECT: 0.5 mg/dL (ref 0.2–1.2)
Bilirubin, Direct: 0.2 mg/dL (ref <=0.2)
TOTAL PROTEIN: 7.1 g/dL (ref 6.1–8.1)
Total Bilirubin: 0.7 mg/dL (ref 0.2–1.2)

## 2015-07-14 LAB — LIPID PANEL
Cholesterol: 162 mg/dL (ref 125–200)
HDL: 38 mg/dL — ABNORMAL LOW (ref >=40)
LDL CALC: 110 mg/dL (ref <130)
Total CHOL/HDL Ratio: 4.3 Ratio (ref <=5.0)
Triglycerides: 71 mg/dL (ref <150)
VLDL: 14 mg/dL (ref <30)

## 2015-07-14 LAB — BASIC METABOLIC PANEL WITH GFR
BUN: 12 mg/dL (ref 7–25)
CHLORIDE: 103 mmol/L (ref 98–110)
CO2: 28 mmol/L (ref 20–31)
Calcium: 9.3 mg/dL (ref 8.6–10.3)
Creat: 0.97 mg/dL (ref 0.60–1.35)
GLUCOSE: 97 mg/dL (ref 65–99)
POTASSIUM: 4.5 mmol/L (ref 3.5–5.3)
Sodium: 138 mmol/L (ref 135–146)

## 2015-07-14 LAB — IRON AND TIBC
%SAT: 30 % (ref 15–60)
Iron: 87 ug/dL (ref 50–180)
TIBC: 289 ug/dL (ref 250–425)
UIBC: 202 ug/dL (ref 125–400)

## 2015-07-14 LAB — TSH: TSH: 0.911 u[IU]/mL (ref 0.350–4.500)

## 2015-07-14 LAB — VITAMIN B12: VITAMIN B 12: 368 pg/mL (ref 211–911)

## 2015-07-14 LAB — MAGNESIUM: Magnesium: 2 mg/dL (ref 1.5–2.5)

## 2015-07-14 MED ORDER — PREDNISONE 20 MG PO TABS: ORAL_TABLET | ORAL | Status: DC

## 2015-07-14 NOTE — Progress Notes (Signed)
Patient ID: John Velez, male   DOB: 06/30/1975, 40 y.o.   MRN: [id number]  Annual  Screening/Preventative Visit And Comprehensive Evaluation & Examination  This very nice 40 y.o. MBM presents for presents for a Wellness/Preventative Visit & comprehensive evaluation and management of multiple medical co-morbidities.  Patient has been followed expectantly for HTN, Prediabetes, Hyperlipidemia, Low T and Vitamin D Deficiency.   Patient is monitored expectantly for labile HTN. Patient's BP has been controlled and today's BP: 124/76 mmHg. Patient denies any cardiac symptoms as chest pain, palpitations, shortness of breath, dizziness or ankle swelling.   Patient's hyperlipidemia is not controlled with diet. In Feb 2014 he had an elevated LDL of 108 and has been attempting dietary control. Last lipids were not at goal with Cholesterol 162; HDL 38*; LDL 110; Triglycerides 71 on 07/14/2015.   Patient has prediabetes since Jan 2011 with A1c 6.1% and then 5.7% in Aug 2013 and patient denies reactive hypoglycemic symptoms, visual blurring, diabetic polys or paresthesias. Last A1c was 5.8% on 01/20/2015.    Finally, patient has history of Vitamin D Deficiency of "26" in 2010 and last vitamin D was still low at 44 on 01/20/2015.   Medication Sig  . Cholecalciferol (VITAMIN D PO) Take 8,000 Int'l Units by mouth daily.   No Known Allergies   Past Medical History  Diagnosis Date  . Vitamin D deficiency   . Prediabetes    Health Maintenance  Topic Date Due  . INFLUENZA VACCINE  01/10/2015  . TETANUS/TDAP  06/12/2015  . HIV Screening  Completed   Immunization History  Administered Date(s) Administered  . PPD Test 08/28/2013, 07/06/2014  . Pneumococcal Polysaccharide-23 04/28/2008  . Td 06/11/2005  . Tdap 07/14/2015   Past Surgical History  Procedure Laterality Date  . No past surgeries     Family History  Problem Relation Age of Onset  . Diabetes Mother   . Cancer Mother     Breast  .  Hypertension Father   . Hyperlipidemia Father   . Cancer Father     Prostate  . Cancer Maternal Grandmother     lung  . Cancer Maternal Grandfather     lung   Social History   Social History  . Marital Status: Married x 4 years    Spouse Name: N/A  . Number of Children: 2 children   Occupational History  . Not on file.   Social History Main Topics  . Smoking status: Never Smoker   . Smokeless tobacco: No  . Alcohol Use: Yes     Comment: ocassional  . Drug Use: No  . Sexual Activity: Active    ROS Constitutional: Denies fever, chills, weight loss/gain, headaches, insomnia,  night sweats or change in appetite. Does c/o fatigue. Eyes: Denies redness, blurred vision, diplopia, discharge, itchy or watery eyes.  ENT: Denies discharge, congestion, post nasal drip, epistaxis, sore throat, earache, hearing loss, dental pain, Tinnitus, Vertigo, Sinus pain or snoring.  Cardio: Denies chest pain, palpitations, irregular heartbeat, syncope, dyspnea, diaphoresis, orthopnea, PND, claudication or edema Respiratory: denies cough, dyspnea, DOE, pleurisy, hoarseness, laryngitis or wheezing.  Gastrointestinal: Denies dysphagia, heartburn, reflux, water brash, pain, cramps, nausea, vomiting, bloating, diarrhea, constipation, hematemesis, melena, hematochezia, jaundice or hemorrhoids Genitourinary: Denies dysuria, frequency, urgency, nocturia, hesitancy, discharge, hematuria or flank pain Musculoskeletal: Denies arthralgia, myalgia, stiffness, Jt. Swelling, pain, limp or strain/sprain. Denies Falls. Skin: Denies puritis, rash, hives, warts, acne, eczema or change in skin lesion Neuro: No weakness, tremor, incoordination, spasms, paresthesia or pain  Psychiatric: Denies confusion, memory loss or sensory loss. Denies Depression. Endocrine: Denies change in weight, skin, hair change, nocturia, and paresthesia, diabetic polys, visual blurring or hyper / hypo glycemic episodes.  Heme/Lymph: No excessive  bleeding, bruising or enlarged lymph nodes.  Physical Exam  BP 124/76 mmHg  Pulse 76  Temp(Src) 97.3 F (36.3 C)  Resp 16  Ht 5' 9"  (1.753 m)  Wt 170 lb 6.4 oz (77.293 kg)  BMI 25.15 kg/m2  General Appearance: Well nourished, in no apparent distress. Eyes: PERRLA, EOMs, conjunctiva no swelling or erythema, normal fundi and vessels. Sinuses: No frontal/maxillary tenderness ENT/Mouth: EACs patent / TMs  nl. Nares clear without erythema, swelling, mucoid exudates. Oral hygiene is good. No erythema, swelling, or exudate. Tongue normal, non-obstructing. Tonsils not swollen or erythematous. Hearing normal.  Neck: Supple, thyroid normal. No bruits, nodes or JVD. Respiratory: Respiratory effort normal.  BS equal and clear bilateral without rales, rhonci, wheezing or stridor. Cardio: Heart sounds are normal with regular rate and rhythm and no murmurs, rubs or gallops. Peripheral pulses are normal and equal bilaterally without edema. No aortic or femoral bruits. Chest: symmetric with normal excursions and percussion.  Abdomen: Soft, with Nl bowel sounds. Nontender, no guarding, rebound, hernias, masses, or organomegaly.  Lymphatics: Non tender without lymphadenopathy.  Genitourinary: No hernias.Testes nl. DRE -deferred for age. Musculoskeletal: Full ROM all peripheral extremities, joint stability, 5/5 strength, and normal gait. Skin: Warm and dry without rashes, lesions, cyanosis, clubbing or  ecchymosis.  Neuro: Cranial nerves intact, reflexes equal bilaterally. Normal muscle tone, no cerebellar symptoms. Sensation intact.  Pysch: Alert and oriented X 3 with normal affect, insight and judgment appropriate.   Assessment and Plan  1. Annual Preventative/Screening Exam   - Zinc 50 MG TABS; Take 1 tablet by mouth daily. - B Complex Vitamins (VITAMIN-B COMPLEX PO); Take 1 capsule by mouth daily. - Cinnamon 500 MG capsule; Take 500 mg by mouth daily. - Tdap vaccine greater than or equal to 7yo  IM - Microalbumin / creatinine urine ratio - EKG 12-Lead - POC Hemoccult Bld/Stl  - Urinalysis, Routine w reflex microscopic  - Vitamin B12 - Iron and TIBC - Testosterone - CBC with Differential/Platelet - BASIC METABOLIC PANEL WITH GFR - Hepatic function panel - Magnesium - Lipid panel - TSH - Hemoglobin A1c - Insulin, random - VITAMIN D 25 Hydroxy   2. Elevated BP  - Microalbumin / creatinine urine ratio - EKG 12-Lead - TSH  3. Hyperlipidemia (LDL Chol)  - Lipid panel - TSH  4. Prediabetes  - Hemoglobin A1c - Insulin, random  5. Vitamin D deficiency  - VITAMIN D 25 Hydroxy   6. Testosterone deficiency  - Testosterone  7. Need for diphtheria-tetanus-pertussis (Tdap) vaccine, adult/adolescent  - Tdap vaccine greater than or equal to 7yo IM  8. Screening for rectal cancer  - POC Hemoccult Bld/Stl   9. Other fatigue  - Vitamin B12 - Iron and TIBC - Testosterone - CBC with Differential/Platelet - TSH  10. Acute upper respiratory infection  - predniSONE 20 MG tablet; 1 tab 3 x day for 3 days, then 1 tab 2 x day for 3 days, then 1 tab 1 x day for 5 days  Disp: 20 tab  11. Medication management  - Urinalysis, Routine w reflex microscopic  - CBC with Differential/Platelet - BASIC METABOLIC PANEL WITH GFR - Hepatic function panel - Magnesium   Continue prudent diet as discussed, weight control, BP monitoring, regular exercise, and medications as discussed.  Discussed med effects and SE's. Routine screening labs and tests as requested with regular follow-up as recommended. Over 40 minutes of exam, counseling, chart review and high complex critical decision making was performed

## 2015-07-14 NOTE — Patient Instructions (Signed)
Recommend Adult Low Dose Aspirin or   coated  Aspirin 81 mg daily   To reduce risk of Colon Cancer 20 %,   Skin Cancer 26 % ,   Melanoma 46%   and   Pancreatic cancer 60%   ++++++++++++++++++++++++++++++++++++++++++++++++++++++ Vitamin D goal   is between 70-100.   Please make sure that you are taking your Vitamin D as directed.   It is very important as a natural anti-inflammatory   helping hair, skin, and nails, as well as reducing stroke and heart attack risk.   It helps your bones and helps with mood.  It also decreases numerous cancer risks so please take it as directed.   Low Vit D is associated with a 200-300% higher risk for CANCER   and 200-300% higher risk for HEART   ATTACK  &  STROKE.   .....................................Marland Kitchen  It is also associated with higher death rate at younger ages,   autoimmune diseases like Rheumatoid arthritis, Lupus, Multiple Sclerosis.     Also many other serious conditions, like depression, Alzheimer's  Dementia, infertility, muscle aches, fatigue, fibromyalgia - just to name a few.  ++++++++++++++++++++++++++++++++++++++++++++++++  Recommend the book "The END of DIETING" by Dr Excell Seltzer   & the book "The END of DIABETES " by Dr Excell Seltzer  At [email or web address] - get book & Audio CD's     Being diabetic has a  300% increased risk for heart attack, stroke, cancer, and alzheimer- type vascular dementia. It is very important that you work harder with diet by avoiding all foods that are white. Avoid white rice (brown & wild rice is OK), white potatoes (sweetpotatoes in moderation is OK), White bread or wheat bread or anything made out of white flour like bagels, donuts, rolls, buns, biscuits, cakes, pastries, cookies, pizza crust, and pasta (made from white flour & egg whites) - vegetarian pasta or spinach or wheat pasta is OK. Multigrain breads like Arnold's or Pepperidge [employer], or multigrain sandwich thins or flatbreads.  Diet,  exercise and weight loss can reverse and cure diabetes in the early stages.  Diet, exercise and weight loss is very important in the control and prevention of complications of diabetes which affects every system in your body, ie. Brain - dementia/stroke, eyes - glaucoma/blindness, heart - heart attack/heart failure, kidneys - dialysis, stomach - gastric paralysis, intestines - malabsorption, nerves - severe painful neuritis, circulation - gangrene & loss of a leg(s), and finally cancer and Alzheimers.    I recommend avoid fried & greasy foods,  sweets/candy, white rice (brown or wild rice or Quinoa is OK), white potatoes (sweet potatoes are OK) - anything made from white flour - bagels, doughnuts, rolls, buns, biscuits,white and wheat breads, pizza crust and traditional pasta made of white flour & egg white(vegetarian pasta or spinach or wheat pasta is OK).  Multi-grain bread is OK - like multi-grain flat bread or sandwich thins. Avoid alcohol in excess. Exercise is also important.    Eat all the vegetables you want - avoid meat, especially red meat and dairy - especially cheese.  Cheese is the most concentrated form of trans-fats which is the worst thing to clog up our arteries. Veggie cheese is OK which can be found in the fresh produce section at [employer] or [employer] or [employer]  ++++++++++++++++++++++++++++++++++++++++++++++++++ DASH Eating Plan  DASH stands for "Dietary Approaches to Stop Hypertension."   The DASH eating plan is a healthy eating plan that has been shown to reduce high blood  pressure (hypertension). Additional health benefits may include reducing the risk of type 2 diabetes mellitus, heart disease, and stroke. The DASH eating plan may also help with weight loss.  WHAT DO I NEED TO KNOW ABOUT THE DASH EATING PLAN?  For the DASH eating plan, you will follow these general guidelines:  Choose foods with a percent daily value for sodium of less than 5% (as listed on the food  label).  Use salt-free seasonings or herbs instead of table salt or sea salt.  Check with your health care provider or pharmacist before using salt substitutes.  Eat lower-sodium products, often labeled as "lower sodium" or "no salt added."  Eat fresh foods.  Eat more vegetables, fruits, and low-fat dairy products.    Choose whole grains. Look for the word "whole" as the first word in the ingredient list.  Choose fish   Limit sweets, desserts, sugars, and sugary drinks.  Choose heart-healthy fats.  Eat veggie cheese   Eat more home-cooked food and less restaurant, buffet, and fast food.  Limit fried foods.  Huffaker foods using methods other than frying.  Limit canned vegetables. If you do use them, rinse them well to decrease the sodium.  When eating at a restaurant, ask that your food be prepared with less salt, or no salt if possible.                      WHAT FOODS CAN I EAT?  Read Dr Fara Olden Fuhrman's books on The End of Dieting & The End of Diabetes  Grains  Whole grain or whole wheat bread. Brown rice. Whole grain or whole wheat pasta. Quinoa, bulgur, and whole grain cereals. Low-sodium cereals. Corn or whole wheat flour tortillas. Whole grain cornbread. Whole grain crackers. Low-sodium crackers.  Vegetables  Fresh or frozen vegetables (raw, steamed, roasted, or grilled). Low-sodium or reduced-sodium tomato and vegetable juices. Low-sodium or reduced-sodium tomato sauce and paste. Low-sodium or reduced-sodium canned vegetables.   Fruits  All fresh, canned (in natural juice), or frozen fruits.  Protein Products   All fish and seafood.  Dried beans, peas, or lentils. Unsalted nuts and seeds. Unsalted canned beans.  Dairy  Low-fat dairy products, such as skim or 1% milk, 2% or reduced-fat cheeses, low-fat ricotta or cottage cheese, or plain low-fat yogurt. Low-sodium or reduced-sodium cheeses.  Fats and Oils  Tub margarines without trans fats. Light or  reduced-fat mayonnaise and salad dressings (reduced sodium). Avocado. Safflower, olive, or canola oils. Natural peanut or almond butter.  Other  Unsalted popcorn and pretzels. The items listed above may not be a complete list of recommended foods or beverages. Contact your dietitian for more options.  +++++++++++++++++++++++++++++++++++++++++++  WHAT FOODS ARE NOT RECOMMENDED?  Grains/ White flour or wheat flour  White bread. White pasta. White rice. Refined cornbread. Bagels and croissants. Crackers that contain trans fat.  Vegetables  Creamed or fried vegetables. Vegetables in a . Regular canned vegetables. Regular canned tomato sauce and paste. Regular tomato and vegetable juices.  Fruits  Dried fruits. Canned fruit in light or heavy syrup. Fruit juice.  Meat and Other Protein Products  Meat in general - RED mwaet & White meat.  Fatty cuts of meat. Ribs, chicken wings, bacon, sausage, bologna, salami, chitterlings, fatback, hot dogs, bratwurst, and packaged luncheon meats.  Dairy  Whole or 2% milk, cream, half-and-half, and cream cheese. Whole-fat or sweetened yogurt. Full-fat cheeses or blue cheese. Nondairy creamers and whipped toppings. Processed cheese, cheese spreads, or  cheese curds.  Condiments  Onion and garlic salt, seasoned salt, table salt, and sea salt. Canned and packaged gravies. Worcestershire sauce. Tartar sauce. Barbecue sauce. Teriyaki sauce. Soy sauce, including reduced sodium. Steak sauce. Fish sauce. Oyster sauce. Cocktail sauce. Horseradish. Ketchup and mustard. Meat flavorings and tenderizers. Bouillon cubes. Hot sauce. Tabasco sauce. Marinades. Taco seasonings. Relishes.  Fats and Oils Butter, stick margarine, lard, shortening and bacon fat. Coconut, palm kernel, or palm oils. Regular salad dressings.  Pickles and olives. Salted popcorn and pretzels.  The items listed above may not be a complete list of foods and beverages to  avoid.  ++++++++++++++++++++++++++++++++++++++  Preventive Care for Adults  A healthy lifestyle and preventive care can promote health and wellness. Preventive health guidelines for men include the following key practices:  A routine yearly physical is a good way to check with your health care provider about your health and preventative screening. It is a chance to share any concerns and updates on your health and to receive a thorough exam.  Visit your dentist for a routine exam and preventative care every 6 months. Brush your teeth twice a day and floss once a day. Good oral hygiene prevents tooth decay and gum disease.  The frequency of eye exams is based on your age, health, family medical history, use of contact lenses, and other factors. Follow your health care provider's recommendations for frequency of eye exams.  Eat a healthy diet. Foods such as vegetables, fruits, whole grains, low-fat dairy products, and lean protein foods contain the nutrients you need without too many calories. Decrease your intake of foods high in solid fats, added sugars, and salt. Eat the right amount of calories for you.Get information about a proper diet from your health care provider, if necessary.  Regular physical exercise is one of the most important things you can do for your health. Most adults should get at least 150 minutes of moderate-intensity exercise (any activity that increases your heart rate and causes you to sweat) each week. In addition, most adults need muscle-strengthening exercises on 2 or more days a week.  Maintain a healthy weight. The body mass index (BMI) is a screening tool to identify possible weight problems. It provides an estimate of body fat based on height and weight. Your health care provider can find your BMI and can help you achieve or maintain a healthy weight.For adults 20 years and older:  A BMI below 18.5 is considered underweight.  A BMI of 18.5 to 24.9 is  normal.  A BMI of 25 to 29.9 is considered overweight.  A BMI of 30 and above is considered obese.  Maintain normal blood lipids and cholesterol levels by exercising and minimizing your intake of saturated fat. Eat a balanced diet with plenty of fruit and vegetables. Blood tests for lipids and cholesterol should begin at age 20 and be repeated every 5 years. If your lipid or cholesterol levels are high, you are over 50, or you are at high risk for heart disease, you may need your cholesterol levels checked more frequently.Ongoing high lipid and cholesterol levels should be treated with medicines if diet and exercise are not working.  If you smoke, find out from your health care provider how to quit. If you do not use tobacco, do not start.  Lung cancer screening is recommended for adults aged 55-80 years who are at high risk for developing lung cancer because of a history of smoking. A yearly low-dose CT scan of the lungs   is recommended for people who have at least a 30-pack-year history of smoking and are a current smoker or have quit within the past 15 years. A pack year of smoking is smoking an average of 1 pack of cigarettes a day for 1 year (for example: 1 pack a day for 30 years or 2 packs a day for 15 years). Yearly screening should continue until the smoker has stopped smoking for at least 15 years. Yearly screening should be stopped for people who develop a health problem that would prevent them from having lung cancer treatment.  If you choose to drink alcohol, do not have more than 2 drinks per day. One drink is considered to be 12 ounces (355 mL) of beer, 5 ounces (148 mL) of wine, or 1.5 ounces (44 mL) of liquor.  High blood pressure causes heart disease and increases the risk of stroke. Your blood pressure should be checked. Ongoing high blood pressure should be treated with medicines, if weight loss and exercise are not effective.  If you are 45-79 years old, ask your health care  provider if you should take aspirin to prevent heart disease.  Diabetes screening involves taking a blood sample to check your fasting blood sugar level. Testing should be considered at a younger age or be carried out more frequently if you are overweight and have at least 1 risk factor for diabetes.  Colorectal cancer can be detected and often prevented. Most routine colorectal cancer screening begins at the age of 50 and continues through age 75. However, your health care provider may recommend screening at an earlier age if you have risk factors for colon cancer. On a yearly basis, your health care provider may provide home test kits to check for hidden blood in the stool. Use of a small camera at the end of a tube to directly examine the colon (sigmoidoscopy or colonoscopy) can detect the earliest forms of colorectal cancer. Talk to your health care provider about this at age 50, when routine screening begins. Direct exam of the colon should be repeated every 5-10 years through age 75, unless early forms of precancerous polyps or small growths are found.  Screening for abdominal aortic aneurysm (AAA)  are recommended for persons over age 50 who have history of hypertensionor who are current or former smokers.  Talk with your health care provider about prostate cancer screening.  Testicular cancer screening is recommended for adult males. Screening includes self-exam, a health care provider exam, and other screening tests. Consult with your health care provider about any symptoms you have or any concerns you have about testicular cancer.  Use sunscreen. Apply sunscreen liberally and repeatedly throughout the day. You should seek shade when your shadow is shorter than you. Protect yourself by wearing long sleeves, pants, a wide-brimmed hat, and sunglasses year round, whenever you are outdoors.  Once a month, do a whole-body skin exam, using a mirror to look at the skin on your back. Tell your health  care provider about new moles, moles that have irregular borders, moles that are larger than a pencil eraser, or moles that have changed in shape or color.  Stay current with required vaccines (immunizations).  Influenza vaccine. All adults should be immunized every year.  Tetanus, diphtheria, and acellular pertussis (Td, Tdap) vaccine. An adult who has not previously received Tdap or who does not know his vaccine status should receive 1 dose of Tdap. This initial dose should be followed by tetanus and diphtheria toxoids (Td)   booster doses every 10 years. Adults with an unknown or incomplete history of completing a 3-dose immunization series with Td-containing vaccines should begin or complete a primary immunization series including a Tdap dose. Adults should receive a Td booster every 10 years.  Zoster vaccine. One dose is recommended for adults aged 60 years or older unless certain conditions are present.    Pneumococcal 13-valent conjugate (PCV13) vaccine. When indicated, a person who is uncertain of his immunization history and has no record of immunization should receive the PCV13 vaccine. An adult aged 19 years or older who has certain medical conditions and has not been previously immunized should receive 1 dose of PCV13 vaccine. This PCV13 should be followed with a dose of pneumococcal polysaccharide (PPSV23) vaccine. The PPSV23 vaccine dose should be obtained at least 8 weeks after the dose of PCV13 vaccine. An adult aged 19 years or older who has certain medical conditions and previously received 1 or more doses of PPSV23 vaccine should receive 1 dose of PCV13. The PCV13 vaccine dose should be obtained 1 or more years after the last PPSV23 vaccine dose.    Pneumococcal polysaccharide (PPSV23) vaccine. When PCV13 is also indicated, PCV13 should be obtained first. All adults aged 65 years and older should be immunized. An adult younger than age 65 years who has certain medical conditions  should be immunized. Any person who resides in a nursing home or long-term care facility should be immunized. An adult smoker should be immunized. People with an immunocompromised condition and certain other conditions should receive both PCV13 and PPSV23 vaccines. People with human immunodeficiency virus (HIV) infection should be immunized as soon as possible after diagnosis. Immunization during chemotherapy or radiation therapy should be avoided. Routine use of PPSV23 vaccine is not recommended for American Indians, [place] Natives, or people younger than 65 years unless there are medical conditions that require PPSV23 vaccine. When indicated, people who have unknown immunization and have no record of immunization should receive PPSV23 vaccine. One-time revaccination 5 years after the first dose of PPSV23 is recommended for people aged 19-64 years who have chronic kidney failure, nephrotic syndrome, asplenia, or immunocompromised conditions. People who received 1-2 doses of PPSV23 before age 65 years should receive another dose of PPSV23 vaccine at age 65 years or later if at least 5 years have passed since the previous dose. Doses of PPSV23 are not needed for people immunized with PPSV23 at or after age 65 years.  Hepatitis A vaccine. Adults who wish to be protected from this disease, have certain high-risk conditions, work with hepatitis A-infected animals, work in hepatitis A research labs, or travel to or work in countries with a high rate of hepatitis A should be immunized. Adults who were previously unvaccinated and who anticipate close contact with an international adoptee during the first 60 days after arrival in the [place] from a country with a high rate of hepatitis A should be immunized.  Hepatitis B vaccine. Adults should be immunized if they wish to be protected from this disease, have certain high-risk conditions, may be exposed to blood or other infectious body fluids, are household  contacts or sex partners of hepatitis B positive people, are clients or workers in certain care facilities, or travel to or work in countries with a high rate of hepatitis B.  Preventive Service / Frequency  Ages 19 to 39  Blood pressure check.  Lipid and cholesterol check.  Hepatitis C blood test.** / For any individual with known risks   for hepatitis C.  Skin self-exam. / Monthly.  Influenza vaccine. / Every year.  Tetanus, diphtheria, and acellular pertussis (Tdap, Td) vaccine.** / Consult your health care provider. 1 dose of Td every 10 years.  HPV vaccine. / 3 doses over 6 months, if 26 or younger.  Measles, mumps, rubella (MMR) vaccine.** / You need at least 1 dose of MMR if you were born in 1957 or later. You may also need a second dose.  Pneumococcal 13-valent conjugate (PCV13) vaccine.** / Consult your health care provider.  Pneumococcal polysaccharide (PPSV23) vaccine.** / 1 to 2 doses if you smoke cigarettes or if you have certain conditions.  Meningococcal vaccine.** / 1 dose if you are age 19 to 21 years and a first-year college student living in a residence hall, or have one of several medical conditions. You may also need additional booster doses.  Hepatitis A vaccine.** / Consult your health care provider.  Hepatitis B vaccine.** / Consult your health care provider.   

## 2015-07-15 LAB — VITAMIN D 25 HYDROXY (VIT D DEFICIENCY, FRACTURES): Vit D, 25-Hydroxy: 85 ng/mL (ref 30–100)

## 2015-07-15 LAB — MICROALBUMIN / CREATININE URINE RATIO
Creatinine, Urine: 153 mg/dL (ref 20–370)
Microalb Creat Ratio: 1 mcg/mg creat (ref <30)
Microalb, Ur: 0.2 mg/dL

## 2015-07-15 LAB — INSULIN, RANDOM: INSULIN: 3.6 u[IU]/mL (ref 2.0–19.6)

## 2015-07-15 LAB — HEMOGLOBIN A1C
HEMOGLOBIN A1C: 5.7 % — AB (ref <5.7)
MEAN PLASMA GLUCOSE: 117 mg/dL — AB (ref <117)

## 2015-07-15 LAB — TESTOSTERONE: Testosterone: 211 ng/dL — ABNORMAL LOW (ref 250–827)

## 2015-12-20 ENCOUNTER — Ambulatory Visit (HOSPITAL_COMMUNITY)
Admission: RE | Admit: 2015-12-20 | Discharge: 2015-12-20 | Disposition: A | Discharge status: Home / Self Care | Payer: Managed Care, Other (non HMO) | Source: Ambulatory Visit | Attending: Internal Medicine | Admitting: Internal Medicine

## 2015-12-20 ENCOUNTER — Encounter: Payer: Self-pay | Admitting: Internal Medicine

## 2015-12-20 ENCOUNTER — Ambulatory Visit (INDEPENDENT_AMBULATORY_CARE_PROVIDER_SITE_OTHER): Payer: Managed Care, Other (non HMO) | Admitting: Internal Medicine

## 2015-12-20 VITALS — BP 122/80 | HR 67 | Temp 97.4°F | Resp 16 | Ht 69.0 in | Wt 170.2 lb

## 2015-12-20 DIAGNOSIS — M79672 Pain in left foot: Secondary | ICD-10-CM | POA: Diagnosis present

## 2015-12-20 DIAGNOSIS — M722 Plantar fascial fibromatosis: Secondary | ICD-10-CM

## 2015-12-20 DIAGNOSIS — M79671 Pain in right foot: Secondary | ICD-10-CM | POA: Diagnosis not present

## 2015-12-20 MED ORDER — MELOXICAM 15 MG PO TABS: 15.0000 mg | ORAL_TABLET | Freq: Every day | ORAL | Status: DC

## 2015-12-20 NOTE — Progress Notes (Signed)
   Subjective:    Patient ID: John Velez, male    DOB: [birth year], 40 y.o.   MRN: [id number]  HPI  Patient presents to the office for evaluation of left heel pain x 3 months.  He reports that the first few steps in the morning can be really painful and then it becomes a dull ache throughout the day.  He reports that he plays a lot of soccer.  He has been using some gel inserts for his heels in his shoes and has bought new tennis shoes.  He reports that he wants to go back to being active.  He reports that he does walk around barefoot in his house.  He reports that he has had plantar fasciitis in the past.  He reports that this was a mild case years ago.  Review of Systems  Constitutional: Negative for fever, chills and fatigue.  Musculoskeletal: Positive for myalgias. Negative for joint swelling and arthralgias.  Skin: Negative for color change.       Objective:   Physical Exam  Constitutional: He is oriented to person, place, and time. He appears well-developed and well-nourished. No distress.  HENT:  Head: Normocephalic.  Mouth/Throat: Oropharynx is clear and moist. No oropharyngeal exudate.  Eyes: Conjunctivae are normal. No scleral icterus.  Neck: Normal range of motion. Neck supple. No JVD present. No thyromegaly present.  Cardiovascular: Normal rate, regular rhythm, normal heart sounds and intact distal pulses.  Exam reveals no gallop and no friction rub.   No murmur heard. Pulmonary/Chest: Effort normal and breath sounds normal. No respiratory distress. He has no wheezes. He has no rales. He exhibits no tenderness.  Abdominal: Soft. Bowel sounds are normal. He exhibits no distension and no mass. There is no tenderness. There is no rebound and no guarding.  Musculoskeletal: Normal range of motion.       Right foot: There is tenderness. There is normal range of motion, no bony tenderness, no swelling, normal capillary refill, no crepitus, no deformity and no laceration.       Left  foot: There is tenderness. There is normal range of motion, no bony tenderness, no swelling, normal capillary refill, no crepitus, no deformity and no laceration.       Feet:  Lymphadenopathy:    He has no cervical adenopathy.  Neurological: He is alert and oriented to person, place, and time. No cranial nerve deficit. Coordination normal.  Skin: Skin is warm and dry. No rash noted. He is not diaphoretic.  Psychiatric: He has a normal mood and affect. His behavior is normal. Judgment and thought content normal.  Nursing note and vitals reviewed.   Filed Vitals:   12/20/15 0927  BP: 122/80  Pulse: 67  Temp: 97.4 F (36.3 C)  Resp: 16          Assessment & Plan:    1. Plantar fasciitis, bilateral -meloxicam -wear shoes all the time (avoid walking barefoot -massage with iced waterbottles or golf balls -arch wraps - DG Foot Complete Left; Future - DG Foot Complete Right; Future

## 2015-12-20 NOTE — Patient Instructions (Signed)
Plantar Fasciitis Plantar fasciitis is a painful foot condition that affects the heel. It occurs when the band of tissue that connects the toes to the heel bone (plantar fascia) becomes irritated. This can happen after exercising too much or doing other repetitive activities (overuse injury). The pain from plantar fasciitis can range from mild irritation to severe pain that makes it difficult for you to walk or move. The pain is usually worse in the morning or after you have been sitting or lying down for a while. CAUSES This condition may be caused by:  Standing for long periods of time.  Wearing shoes that do not fit.  Doing high-impact activities, including running, aerobics, and ballet.  Being overweight.  Having an abnormal way of walking (gait).  Having tight calf muscles.  Having high arches in your feet.  Starting a new athletic activity. SYMPTOMS The main symptom of this condition is heel pain. Other symptoms include:  Pain that gets worse after activity or exercise.  Pain that is worse in the morning or after resting.  Pain that goes away after you walk for a few minutes. DIAGNOSIS This condition may be diagnosed based on your signs and symptoms. Your health care provider will also do a physical exam to check for:  A tender area on the bottom of your foot.  A high arch in your foot.  Pain when you move your foot.  Difficulty moving your foot. You may also need to have imaging studies to confirm the diagnosis. These can include:  X-rays.  Ultrasound.  MRI. TREATMENT  Treatment for plantar fasciitis depends on the severity of the condition. Your treatment may include:  Rest, ice, and over-the-counter pain medicines to manage your pain.  Exercises to stretch your calves and your plantar fascia.  A splint that holds your foot in a stretched, upward position while you sleep (night splint).  Physical therapy to relieve symptoms and prevent problems in the  future.  Cortisone injections to relieve severe pain.  Extracorporeal shock wave therapy (ESWT) to stimulate damaged plantar fascia with electrical impulses. It is often used as a last resort before surgery.  Surgery, if other treatments have not worked after 12 months. HOME CARE INSTRUCTIONS  Take medicines only as directed by your health care provider.  Avoid activities that cause pain.  Roll the bottom of your foot over a bag of ice or a bottle of cold water. Do this for 20 minutes, 3-4 times a day.  Perform simple stretches as directed by your health care provider.  Try wearing athletic shoes with air-sole or gel-sole cushions or soft shoe inserts.  Wear a night splint while sleeping, if directed by your health care provider.  Keep all follow-up appointments with your health care provider. PREVENTION   Do not perform exercises or activities that cause heel pain.  Consider finding low-impact activities if you continue to have problems.  Lose weight if you need to. The best way to prevent plantar fasciitis is to avoid the activities that aggravate your plantar fascia. SEEK MEDICAL CARE IF:  Your symptoms do not go away after treatment with home care measures.  Your pain gets worse.  Your pain affects your ability to move or do your daily activities.   This information is not intended to replace advice given to you by your health care provider. Make sure you discuss any questions you have with your health care provider.   Document Released: 02/20/2001 Document Revised: 02/16/2015 Document Reviewed: 04/07/2014 Elsevier   Interactive Patient Education 2016 Elsevier Inc.  

## 2016-08-01 ENCOUNTER — Encounter: Payer: Self-pay | Admitting: Internal Medicine

## 2016-09-25 ENCOUNTER — Ambulatory Visit (INDEPENDENT_AMBULATORY_CARE_PROVIDER_SITE_OTHER): Payer: Managed Care, Other (non HMO) | Admitting: Internal Medicine

## 2016-09-25 ENCOUNTER — Encounter: Payer: Self-pay | Admitting: Internal Medicine

## 2016-09-25 VITALS — BP 124/76 | HR 64 | Temp 97.5°F | Resp 16 | Ht 69.0 in | Wt 166.6 lb

## 2016-09-25 DIAGNOSIS — E559 Vitamin D deficiency, unspecified: Secondary | ICD-10-CM | POA: Diagnosis not present

## 2016-09-25 DIAGNOSIS — Z1212 Encounter for screening for malignant neoplasm of rectum: Secondary | ICD-10-CM

## 2016-09-25 DIAGNOSIS — R7303 Prediabetes: Secondary | ICD-10-CM

## 2016-09-25 DIAGNOSIS — E349 Endocrine disorder, unspecified: Secondary | ICD-10-CM

## 2016-09-25 DIAGNOSIS — Z111 Encounter for screening for respiratory tuberculosis: Secondary | ICD-10-CM | POA: Diagnosis not present

## 2016-09-25 DIAGNOSIS — Z0000 Encounter for general adult medical examination without abnormal findings: Secondary | ICD-10-CM | POA: Diagnosis not present

## 2016-09-25 DIAGNOSIS — Z79899 Other long term (current) drug therapy: Secondary | ICD-10-CM

## 2016-09-25 DIAGNOSIS — E782 Mixed hyperlipidemia: Secondary | ICD-10-CM

## 2016-09-25 DIAGNOSIS — Z136 Encounter for screening for cardiovascular disorders: Secondary | ICD-10-CM

## 2016-09-25 DIAGNOSIS — R0989 Other specified symptoms and signs involving the circulatory and respiratory systems: Secondary | ICD-10-CM

## 2016-09-25 DIAGNOSIS — I10 Essential (primary) hypertension: Secondary | ICD-10-CM

## 2016-09-25 DIAGNOSIS — R5383 Other fatigue: Secondary | ICD-10-CM

## 2016-09-25 DIAGNOSIS — Z0001 Encounter for general adult medical examination with abnormal findings: Secondary | ICD-10-CM

## 2016-09-25 LAB — BASIC METABOLIC PANEL WITH GFR
BUN: 8 mg/dL (ref 7–25)
CALCIUM: 9.6 mg/dL (ref 8.6–10.3)
CHLORIDE: 104 mmol/L (ref 98–110)
CO2: 27 mmol/L (ref 20–31)
CREATININE: 0.99 mg/dL (ref 0.60–1.35)
GFR, Est Non African American: >89 mL/min (ref >=60)
Glucose, Bld: 94 mg/dL (ref 65–99)
Potassium: 4.1 mmol/L (ref 3.5–5.3)
Sodium: 139 mmol/L (ref 135–146)

## 2016-09-25 LAB — HEPATIC FUNCTION PANEL
ALT: 25 U/L (ref 9–46)
AST: 22 U/L (ref 10–40)
Albumin: 4.5 g/dL (ref 3.6–5.1)
Alkaline Phosphatase: 56 U/L (ref 40–115)
BILIRUBIN DIRECT: 0.1 mg/dL (ref <=0.2)
Indirect Bilirubin: 0.4 mg/dL (ref 0.2–1.2)
Total Bilirubin: 0.5 mg/dL (ref 0.2–1.2)
Total Protein: 7.6 g/dL (ref 6.1–8.1)

## 2016-09-25 LAB — LIPID PANEL
CHOL/HDL RATIO: 3.6 ratio (ref <5.0)
CHOLESTEROL: 155 mg/dL (ref <200)
HDL: 43 mg/dL (ref >40)
LDL Cholesterol: 97 mg/dL (ref <100)
TRIGLYCERIDES: 74 mg/dL (ref <150)
VLDL: 15 mg/dL (ref <30)

## 2016-09-25 LAB — CBC WITH DIFFERENTIAL/PLATELET
BASOS ABS: 76 {cells}/uL (ref 0–200)
Basophils Relative: 1 %
Eosinophils Absolute: 456 cells/uL (ref 15–500)
Eosinophils Relative: 6 %
HCT: 45.5 % (ref 38.5–50.0)
Hemoglobin: 15.4 g/dL (ref 13.2–17.1)
Lymphocytes Relative: 25 %
Lymphs Abs: 1900 cells/uL (ref 850–3900)
MCH: 29.4 pg (ref 27.0–33.0)
MCHC: 33.8 g/dL (ref 32.0–36.0)
MCV: 87 fL (ref 80.0–100.0)
MPV: 8.8 fL (ref 7.5–12.5)
Monocytes Absolute: 608 cells/uL (ref 200–950)
Monocytes Relative: 8 %
NEUTROS ABS: 4560 {cells}/uL (ref 1500–7800)
Neutrophils Relative %: 60 %
PLATELETS: 273 10*3/uL (ref 140–400)
RBC: 5.23 MIL/uL (ref 4.20–5.80)
RDW: 13.6 % (ref 11.0–15.0)
WBC: 7.6 10*3/uL (ref 3.8–10.8)

## 2016-09-25 LAB — TESTOSTERONE: TESTOSTERONE: 269 ng/dL (ref 250–827)

## 2016-09-25 LAB — TSH: TSH: 1.12 mIU/L (ref 0.40–4.50)

## 2016-09-25 LAB — IRON AND TIBC
%SAT: 26 % (ref 15–60)
IRON: 82 ug/dL (ref 50–180)
TIBC: 314 ug/dL (ref 250–425)
UIBC: 232 ug/dL (ref 125–400)

## 2016-09-25 NOTE — Patient Instructions (Signed)

## 2016-09-25 NOTE — Progress Notes (Signed)
[facility]   Unk Pinto, M.D.    Uvaldo Bristle. Silverio Lay, P.A.-C      Starlyn Skeans, P.A.-C  [facility]                [address] [place] [address] Telephone [phone] Telefax [phone] Annual  Screening/Preventative Visit  & Comprehensive Evaluation & Examination     This very nice 41 y.o. Velez presents for a Screening/Preventative Visit & comprehensive evaluation and management of multiple medical co-morbidities.  Patient has been followed expectantly for for labile elevated BP, Prediabetes, Hyperlipidemia and Vitamin D Deficiency.     Patient is followed expectantly for labile elevated BP and his BP has been controlled with today's BP at goal - 124/76. Patient denies any cardiac symptoms as chest pain, palpitations, shortness of breath, dizziness or ankle swelling.     Patient's hyperlipidemia is controlled with diet and medications. Patient denies myalgias or other medication SE's. Last lipids were at goal: Lab Results  Component Value Date   CHOL 162 07/14/2015   HDL 38 (L) 07/14/2015   LDLCALC 110 07/14/2015   TRIG 71 07/14/2015   CHOLHDL 4.3 07/14/2015      Patient has prediabetes with elevated A1c 6.1% in 2011 and 5.7% in 2013. Patient denies reactive hypoglycemic symptoms, visual blurring, diabetic polys or paresthesias. Last A1c was near goal: Lab Results  Component Value Date   HGBA1C 5.7 (H) 07/14/2015        Patient also has hx/o Low Testosterone of 222 in 2016  and 306 in 2015 past and has deferred treatment. Finally, patient has history of Vitamin D Deficiency ("26" in 2010) and last vitamin D was at goal: Lab Results  Component Value Date   VD25OH 48 07/14/2015   Current Outpatient Prescriptions on File Prior to Visit  Medication Sig  . Cholecalciferol (VITAMIN D PO) Take 8,000 Int'l Units by mouth daily.  . Cinnamon 500 MG capsule Take 500 mg by mouth daily.   . Nutritional Supplements (JUICE PLUS FIBRE) LIQD Take by mouth.   No current facility-administered medications on file prior to visit.    No Known Allergies Past Medical History:  Diagnosis Date  . Prediabetes   . Vitamin D deficiency    Health Maintenance  Topic Date Due  . INFLUENZA VACCINE  01/09/2017  . TETANUS/TDAP  07/13/2025  . HIV Screening  Completed   Immunization History  Administered Date(s) Administered  . PPD Test 08/28/2013, 07/06/2014  . Pneumococcal Polysaccharide-23 04/28/2008  . Td 06/11/2005  . Tdap 07/14/2015   Past Surgical History:  Procedure Laterality Date  . NO PAST SURGERIES     Family History  Problem Relation Age of Onset  . Diabetes Mother   . Cancer Mother     Breast  . Hypertension Father   . Hyperlipidemia Father   . Cancer Father     Prostate  . Cancer Maternal Grandmother     lung  . Cancer Maternal Grandfather     lung   Social History   Social History  . Marital status: Single    Spouse name: N/A  . Number of children: N/A  . Years of education: N/A   Occupational History  . Not on file.   Social History Main Topics  . Smoking status: Never Smoker  . Smokeless tobacco: Not on file  . Alcohol use  Yes     Comment: ocassional  . Drug use: No  . Sexual activity: Not on file   Other Topics Concern  . Not on file   Social History Narrative  . No narrative on file    ROS Constitutional: Denies fever, chills, weight loss/gain, headaches, insomnia,  night sweats or change in appetite. Does c/o fatigue. Eyes: Denies redness, blurred vision, diplopia, discharge, itchy or watery eyes.  ENT: Denies discharge, congestion, post nasal drip, epistaxis, sore throat, earache, hearing loss, dental pain, Tinnitus, Vertigo, Sinus pain or snoring.  Cardio: Denies chest pain, palpitations, irregular heartbeat, syncope, dyspnea, diaphoresis, orthopnea, PND, claudication or edema Respiratory: denies cough, dyspnea, DOE, pleurisy,  hoarseness, laryngitis or wheezing.  Gastrointestinal: Denies dysphagia, heartburn, reflux, water brash, pain, cramps, nausea, vomiting, bloating, diarrhea, constipation, hematemesis, melena, hematochezia, jaundice or hemorrhoids Genitourinary: Denies dysuria, frequency, urgency, nocturia, hesitancy, discharge, hematuria or flank pain Musculoskeletal: Denies arthralgia, myalgia, stiffness, Jt. Swelling, pain, limp or strain/sprain. Denies Falls. Skin: Denies puritis, rash, hives, warts, acne, eczema or change in skin lesion Neuro: No weakness, tremor, incoordination, spasms, paresthesia or pain Psychiatric: Denies confusion, memory loss or sensory loss. Denies Depression. Endocrine: Denies change in weight, skin, hair change, nocturia, and paresthesia, diabetic polys, visual blurring or hyper / hypo glycemic episodes.  Heme/Lymph: No excessive bleeding, bruising or enlarged lymph nodes.  Physical Exam  BP 124/76   Pulse 64   Temp 97.5 F (36.4 C)   Resp 16   Ht 5' 9"  (1.753 m)   Wt 166 lb 9.6 oz (75.6 kg)   BMI 24.60 kg/m   General Appearance: Well nourished and well groomed and in no apparent distress.  Eyes: PERRLA, EOMs, conjunctiva no swelling or erythema, normal fundi and vessels. Sinuses: No frontal/maxillary tenderness ENT/Mouth: EACs patent / TMs  nl. Nares clear without erythema, swelling, mucoid exudates. Oral hygiene is good. No erythema, swelling, or exudate. Tongue normal, non-obstructing. Tonsils not swollen or erythematous. Hearing normal.  Neck: Supple, thyroid normal. No bruits, nodes or JVD. Respiratory: Respiratory effort normal.  BS equal and clear bilateral without rales, rhonci, wheezing or stridor. Cardio: Heart sounds are normal with regular rate and rhythm and no murmurs, rubs or gallops. Peripheral pulses are normal and equal bilaterally without edema. No aortic or femoral bruits. Chest: symmetric with normal excursions and percussion.  Abdomen: Soft, with Nl  bowel sounds. Nontender, no guarding, rebound, hernias, masses, or organomegaly.  Lymphatics: Non tender without lymphadenopathy.  Genitourinary: No hernias.Testes nl. DRE - deferred for age.  Musculoskeletal: Full ROM all peripheral extremities, joint stability, 5/5 strength, and normal gait. Skin: Warm and dry without rashes, lesions, cyanosis, clubbing or  ecchymosis.  Neuro: Cranial nerves intact, reflexes equal bilaterally. Normal muscle tone, no cerebellar symptoms. Sensation intact.  Pysch: Alert and oriented X 3 with normal affect, insight and judgment appropriate.   Assessment and Plan  1. Annual Preventative/Screening Exam    2. Labile hypertension  - EKG 12-Lead - Urinalysis, Routine w reflex microscopic - CBC with Differential/Platelet - BASIC METABOLIC PANEL WITH GFR - Magnesium  3. Mixed hyperlipidemia  - EKG 12-Lead - Hepatic function panel - Lipid panel - TSH  4. Prediabetes  - EKG 12-Lead - Hemoglobin A1c - Insulin, random  5. Vitamin D deficiency  - VITAMIN D 25 Hydroxy   6. Testosterone deficiency  - Testosterone  7. Screening for rectal cancer  - POC Hemoccult Bld/Stl   8. Screening for ischemic heart disease  - EKG 12-Lead  9.  Fatigue, unspecified type  - Vitamin B12 - Iron and TIBC - Testosterone - CBC with Differential/Platelet  10. Medication management  - Urinalysis, Routine w reflex microscopic - Microalbumin / creatinine urine ratio - CBC with Differential/Platelet - BASIC METABOLIC PANEL WITH GFR - Hepatic function panel - Magnesium - Lipid panel - TSH - Hemoglobin A1c - Insulin, random - VITAMIN D 25 Hydroxy   11. Screening examination for pulmonary tuberculosis  - PPD      Patient was counseled in prudent diet, weight contro to achieve/maintain BMI less than 25, BP monitoring, regular exercise and medications as discussed.  Discussed med effects and SE's. Routine screening labs and tests as requested with regular  follow-up as recommended. Over 40 minutes of exam, counseling, chart review and high complex critical decision making was performed

## 2016-09-26 LAB — URINALYSIS, ROUTINE W REFLEX MICROSCOPIC
Bilirubin Urine: NEGATIVE
GLUCOSE, UA: NEGATIVE
Hgb urine dipstick: NEGATIVE
KETONES UR: NEGATIVE
Leukocytes, UA: NEGATIVE
NITRITE: NEGATIVE
PH: 6 (ref 5.0–8.0)
Protein, ur: NEGATIVE
SPECIFIC GRAVITY, URINE: 1.02 (ref 1.001–1.035)

## 2016-09-26 LAB — MICROALBUMIN / CREATININE URINE RATIO
Creatinine, Urine: 166 mg/dL (ref 20–370)
MICROALB UR: 0.4 mg/dL
MICROALB/CREAT RATIO: 2 ug/mg{creat} (ref <30)

## 2016-09-26 LAB — MAGNESIUM: MAGNESIUM: 2.1 mg/dL (ref 1.5–2.5)

## 2016-09-26 LAB — VITAMIN B12: Vitamin B-12: 319 pg/mL (ref 200–1100)

## 2016-09-26 LAB — HEMOGLOBIN A1C
Hgb A1c MFr Bld: 5.3 % (ref <5.7)
Mean Plasma Glucose: 105 mg/dL

## 2016-09-26 LAB — VITAMIN D 25 HYDROXY (VIT D DEFICIENCY, FRACTURES): VIT D 25 HYDROXY: 54 ng/mL (ref 30–100)

## 2016-09-26 LAB — INSULIN, RANDOM: Insulin: 5.8 u[IU]/mL (ref 2.0–19.6)

## 2016-10-02 LAB — TB SKIN TEST
INDURATION: 0 mm
TB Skin Test: NEGATIVE

## 2017-04-25 ENCOUNTER — Other Ambulatory Visit: Payer: Self-pay

## 2017-04-25 ENCOUNTER — Encounter: Payer: Self-pay | Admitting: *Deleted

## 2017-04-25 ENCOUNTER — Emergency Department: Payer: Managed Care, Other (non HMO)

## 2017-04-25 ENCOUNTER — Emergency Department
Admission: EM | Admit: 2017-04-25 | Discharge: 2017-04-25 | Disposition: A | Discharge status: Home / Self Care | Payer: Managed Care, Other (non HMO) | Attending: Emergency Medicine | Admitting: Emergency Medicine

## 2017-04-25 DIAGNOSIS — Z79899 Other long term (current) drug therapy: Secondary | ICD-10-CM | POA: Diagnosis not present

## 2017-04-25 DIAGNOSIS — N200 Calculus of kidney: Secondary | ICD-10-CM | POA: Insufficient documentation

## 2017-04-25 DIAGNOSIS — R300 Dysuria: Secondary | ICD-10-CM | POA: Insufficient documentation

## 2017-04-25 DIAGNOSIS — R109 Unspecified abdominal pain: Secondary | ICD-10-CM | POA: Diagnosis present

## 2017-04-25 LAB — URINALYSIS, COMPLETE (UACMP) WITH MICROSCOPIC
BACTERIA UA: NONE SEEN
Bilirubin Urine: NEGATIVE
Glucose, UA: NEGATIVE mg/dL
Hgb urine dipstick: NEGATIVE
KETONES UR: NEGATIVE mg/dL
Nitrite: NEGATIVE
PROTEIN: NEGATIVE mg/dL
SQUAMOUS EPITHELIAL / LPF: NONE SEEN
Specific Gravity, Urine: 1.006 (ref 1.005–1.030)
pH: 6 (ref 5.0–8.0)

## 2017-04-25 LAB — COMPREHENSIVE METABOLIC PANEL
ALBUMIN: 4.7 g/dL (ref 3.5–5.0)
ALT: 20 U/L (ref 17–63)
AST: 26 U/L (ref 15–41)
Alkaline Phosphatase: 54 U/L (ref 38–126)
Anion gap: 11 (ref 5–15)
BUN: 8 mg/dL (ref 6–20)
CHLORIDE: 104 mmol/L (ref 101–111)
CO2: 23 mmol/L (ref 22–32)
Calcium: 9.3 mg/dL (ref 8.9–10.3)
Creatinine, Ser: 0.94 mg/dL (ref 0.61–1.24)
GFR calc Af Amer: >60 mL/min (ref >60)
GFR calc non Af Amer: >60 mL/min (ref >60)
GLUCOSE: 72 mg/dL (ref 65–99)
POTASSIUM: 4 mmol/L (ref 3.5–5.1)
SODIUM: 138 mmol/L (ref 135–145)
Total Bilirubin: 0.9 mg/dL (ref 0.3–1.2)
Total Protein: 7.7 g/dL (ref 6.5–8.1)

## 2017-04-25 LAB — CHLAMYDIA/NGC RT PCR (ARMC ONLY)
Chlamydia Tr: NOT DETECTED
N GONORRHOEAE: NOT DETECTED

## 2017-04-25 NOTE — ED Triage Notes (Signed)
Pt has left flank pain.  Pt reports diff urinating and back pain.  Pt states he is dribbling urine and has burning.  Pt alert.

## 2017-04-25 NOTE — Discharge Instructions (Signed)
Today we found a kidney stone, although it is not clear that this is the cause of your symptoms.  Primary care physician this coming Monday for reevaluation return to the emergency department sooner for any concerns whatsoever.  It was a pleasure to take care of you today, and thank you for coming to our emergency department.  If you have any questions or concerns before leaving please ask the nurse to grab me and I'm more than happy to go through your aftercare instructions again.  If you were prescribed any opioid pain medication today such as Norco, Vicodin, Percocet, morphine, hydrocodone, or oxycodone please make sure you do not drive when you are taking this medication as it can alter your ability to drive safely.  If you have any concerns once you are home that you are not improving or are in fact getting worse before you can make it to your follow-up appointment, please do not hesitate to call 911 and come back for further evaluation.  Darel Hong, MD  Results for orders placed or performed during the hospital encounter of 04/25/17  Comprehensive metabolic panel  Result Value Ref Range   Sodium 138 135 - 145 mmol/L   Potassium 4.0 3.5 - 5.1 mmol/L   Chloride 104 101 - 111 mmol/L   CO2 23 22 - 32 mmol/L   Glucose, Bld 72 65 - 99 mg/dL   BUN 8 6 - 20 mg/dL   Creatinine, Ser 0.94 0.61 - 1.24 mg/dL   Calcium 9.3 8.9 - 10.3 mg/dL   Total Protein 7.7 6.5 - 8.1 g/dL   Albumin 4.7 3.5 - 5.0 g/dL   AST 26 15 - 41 U/L   ALT 20 17 - 63 U/L   Alkaline Phosphatase 54 38 - 126 U/L   Total Bilirubin 0.9 0.3 - 1.2 mg/dL   GFR calc non Af Amer >60 >60 mL/min   GFR calc Af Amer >60 >60 mL/min   Anion gap 11 5 - 15  Urinalysis, Complete w Microscopic  Result Value Ref Range   Color, Urine YELLOW (A) YELLOW   APPearance CLEAR (A) CLEAR   Specific Gravity, Urine 1.006 1.005 - 1.030   pH 6.0 5.0 - 8.0   Glucose, UA NEGATIVE NEGATIVE mg/dL   Hgb urine dipstick NEGATIVE NEGATIVE   Bilirubin  Urine NEGATIVE NEGATIVE   Ketones, ur NEGATIVE NEGATIVE mg/dL   Protein, ur NEGATIVE NEGATIVE mg/dL   Nitrite NEGATIVE NEGATIVE   Leukocytes, UA TRACE (A) NEGATIVE   RBC / HPF 0-5 0 - 5 RBC/hpf   WBC, UA 0-5 0 - 5 WBC/hpf   Bacteria, UA NONE SEEN NONE SEEN   Squamous Epithelial / LPF NONE SEEN NONE SEEN   Ct Renal Stone Study  Result Date: 04/25/2017 CLINICAL DATA:  Left-sided flank pain EXAM: CT ABDOMEN AND PELVIS WITHOUT CONTRAST TECHNIQUE: Multidetector CT imaging of the abdomen and pelvis was performed following the standard protocol without IV contrast. COMPARISON:  None. FINDINGS: Lower chest: No acute abnormality. Hepatobiliary: No focal liver abnormality is seen. No gallstones, gallbladder wall thickening, or biliary dilatation. Pancreas: Unremarkable. No pancreatic ductal dilatation or surrounding inflammatory changes. Spleen: Normal in size without focal abnormality. Adrenals/Urinary Tract: Adrenal glands are within normal limits. No hydronephrosis. Punctate nonobstructing stone in the mid left kidney. The bladder is unremarkable. Stomach/Bowel: Stomach is within normal limits. Appendix appears normal. No evidence of bowel wall thickening, distention, or inflammatory changes. Vascular/Lymphatic: No significant vascular findings are present. No enlarged abdominal or pelvic lymph  nodes. Reproductive: Prostate is unremarkable. Other: No abdominal wall hernia or abnormality. No abdominopelvic ascites. Musculoskeletal: No acute or significant osseous findings. IMPRESSION: Negative for hydronephrosis or ureteral stone. Punctate nonobstructing stone in the left kidney. No acute abnormality is visualized. Electronically Signed   By: Donavan Foil M.D.   On: 04/25/2017 18:06

## 2017-04-25 NOTE — ED Provider Notes (Signed)
[facility] Emergency Department Provider Note  ____________________________________________   First MD Initiated Contact with Patient 04/25/17 1715     (approximate)  I have reviewed the triage vital signs and the nursing notes.   HISTORY  Chief Complaint Flank Pain   HPI John Velez is a 41 y.o. male who self presents to the emergency department with left flank pain and urinary frequency.  His flank pain was actually 1 week ago with sudden onset moderate to severe radiating towards his groin.  It lasted roughly 24 hours and has subsequently mostly resolved.  For the last 2 days however he is noted with decreased urinary output and frequency and burning with urination.  He is sexually monogamous with his wife and is not had another partner in over 5 years.  He has no history of kidney stones.  Nothing seems to make his symptoms better or worse.  The dysuria had an insidious onset and has been slowly improving.  No fevers or chills.  Past Medical History:  Diagnosis Date  . Prediabetes   . Vitamin D deficiency     Patient Active Problem List   Diagnosis Date Noted  . Elevated BP 07/06/2014  . Medication management 07/06/2014  . Testosterone deficiency 08/28/2013  . Hyperlipidemia (LDL Chol) 08/28/2013  . Vitamin D deficiency   . Prediabetes     Past Surgical History:  Procedure Laterality Date  . NO PAST SURGERIES      Prior to Admission medications   Medication Sig Start Date End Date Taking? Authorizing Provider  Cholecalciferol (VITAMIN D PO) Take 8,000 Int'l Units by mouth daily.    [provider]  Cinnamon 500 MG capsule Take 500 mg by mouth daily.    [provider]  Nutritional Supplements (JUICE PLUS FIBRE) LIQD Take by mouth.    [provider]    Allergies Patient has no known allergies.  Family History  Problem Relation Age of Onset  . Diabetes Mother   . Cancer Mother        Breast  .  Hypertension Father   . Hyperlipidemia Father   . Cancer Father        Prostate  . Cancer Maternal Grandmother        lung  . Cancer Maternal Grandfather        lung    Social History Social History   Tobacco Use  . Smoking status: Never Smoker  . Smokeless tobacco: Never Used  Substance Use Topics  . Alcohol use: Yes    Comment: ocassional  . Drug use: No    Review of Systems Constitutional: No fever/chills Eyes: No visual changes. ENT: No sore throat. Cardiovascular: Denies chest pain. Respiratory: Denies shortness of breath. Gastrointestinal: Positive for abdominal pain.  No nausea, no vomiting.  No diarrhea.  No constipation. Genitourinary: Positive for dysuria. Musculoskeletal: Positive for back pain. Skin: Negative for rash. Neurological: Negative for headaches, focal weakness or numbness.   ____________________________________________   PHYSICAL EXAM:  VITAL SIGNS: ED Triage Vitals  Enc Vitals Group     BP 04/25/17 1547 135/85     Pulse Rate 04/25/17 1547 63     Resp 04/25/17 1547 18     Temp 04/25/17 1547 98.4 F (36.9 C)     Temp Source 04/25/17 1547 Oral     SpO2 04/25/17 1547 99 %     Weight 04/25/17 1549 170 lb (77.1 kg)     Height 04/25/17 1549 5' 9"  (1.753  m)     Head Circumference --      Peak Flow --      Pain Score 04/25/17 1547 8     Pain Loc --      Pain Edu? --      Excl. in [place]? --     Constitutional: Alert and oriented x4 pleasant cooperative speaks in full clear sentences no diaphoresis Eyes: PERRL EOMI. Head: Atraumatic. Nose: No congestion/rhinnorhea. Mouth/Throat: No trismus Neck: No stridor.   Cardiovascular: Normal rate, regular rhythm. Grossly normal heart sounds.  Good peripheral circulation. Respiratory: Normal respiratory effort.  No retractions. Lungs CTAB and moving good air Gastrointestinal: Soft nondistended nontender no rebound or guarding no peritonitis no McBurney's tenderness negative Rovsing's no  costovertebral tenderness Genitourinary exam circumcised phallus no discharge no testicular tenderness swelling or discomfort Musculoskeletal: No lower extremity edema   Neurologic:  Normal speech and language. No gross focal neurologic deficits are appreciated. Skin:  Skin is warm, dry and intact. No rash noted. Psychiatric: Mood and affect are normal. Speech and behavior are normal.    ____________________________________________   DIFFERENTIAL includes but not limited to  Gonorrhea, chlamydia, kidney stone, pyelonephritis ____________________________________________   LABS (all labs ordered are listed, but only abnormal results are displayed)  Labs Reviewed  URINALYSIS, COMPLETE (UACMP) WITH MICROSCOPIC - Abnormal; Notable for the following components:      Result Value   Color, Urine YELLOW (*)    APPearance CLEAR (*)    Leukocytes, UA TRACE (*)    All other components within normal limits  CHLAMYDIA/NGC RT PCR ([facility] ONLY)  COMPREHENSIVE METABOLIC PANEL    Blood work reviewed and interpreted by me with no acute disease __________________________________________  EKG   ____________________________________________  RADIOLOGY  CT stone reviewed by me shows no etiology of his symptoms although does show a punctate left-sided kidney stone still within the kidney ____________________________________________   PROCEDURES  Procedure(s) performed: no  Procedures  Critical Care performed: no  Observation: no ____________________________________________   INITIAL IMPRESSION / ASSESSMENT AND PLAN / ED COURSE  Pertinent labs & imaging results that were available during my care of the patient were reviewed by me and considered in my medical decision making (see chart for details).       ----------------------------------------- 6:22 PM on 04/25/2017 -----------------------------------------  The patient CT scan does show a punctate left-sided  kidney stone, although no ureteral lithiasis and no hydronephrosis.  I had a lengthy discussion with the patient regarding diagnostic uncertainty and that he either has a small stone that is less than 3 mm and in between the cuts of the CT scan where he has recently passed a stone or he has dysuria from another etiology.  His symptoms have already improved and he says he is able to follow-up with his primary care physician this coming Monday for reevaluation.  At this point the patient is medically stable for outpatient management verbalizes understanding and agree with the plan. ____________________________________________   FINAL CLINICAL IMPRESSION(S) / ED DIAGNOSES  Final diagnoses:  Dysuria  Kidney stone      NEW MEDICATIONS STARTED DURING THIS VISIT:  This SmartLink is deprecated. Use AVSMEDLIST instead to display the medication list for a patient.   Note:  This document was prepared using Dragon voice recognition software and may include unintentional dictation errors.     Darel Hong, MD 04/26/17 1500

## 2017-04-25 NOTE — ED Notes (Signed)
Lab needs recollect on light green and purple tube.

## 2017-04-26 ENCOUNTER — Ambulatory Visit: Payer: Self-pay | Admitting: Physician Assistant

## 2017-05-01 ENCOUNTER — Ambulatory Visit: Payer: Managed Care, Other (non HMO) | Admitting: Internal Medicine

## 2017-05-01 VITALS — BP 114/66 | HR 72 | Temp 97.3°F | Resp 16 | Ht 69.0 in | Wt 165.6 lb

## 2017-05-01 DIAGNOSIS — Z23 Encounter for immunization: Secondary | ICD-10-CM

## 2017-05-01 DIAGNOSIS — N410 Acute prostatitis: Secondary | ICD-10-CM | POA: Diagnosis not present

## 2017-05-01 MED ORDER — TAMSULOSIN HCL 0.4 MG PO CAPS: ORAL_CAPSULE | ORAL | 2 refills | Status: DC

## 2017-05-01 MED ORDER — CIPROFLOXACIN HCL 500 MG PO TABS: ORAL_TABLET | ORAL | 0 refills | Status: DC

## 2017-05-01 NOTE — Progress Notes (Signed)
  Subjective:    Patient ID: John Velez, male    DOB: 1976/01/29, 41 y.o.   MRN: [id number]  HPI  Patient is a 41 yo MBM who presents for f/u from 11/15 ER visit w.th L flank pain and lower urinary tract sx's as frequency, decreased force of stream , ? hesitancy and end voiding dysuria. At the ER , his U/A and Chlamydia/GC tests were negative and CT Abd was negative &  found no hydronephrosis or ureteral calculus, but the was a ? Of a punctuate non obstructing stone in the L kidney.  Patient's c/o today are decreased urinary stream and end voiding dysuria.   Medication Sig  . aspirin EC 81 MG tablet Take 81 mg by mouth daily.  Marland Kitchen b complex vitamins capsule Take 1 capsule by mouth daily.  . Cholecalciferol (VITAMIN D PO) Take 8,000 Int'l Units by mouth daily.  . Cinnamon 500 MG capsule Take 500 mg by mouth daily.  . Nutritional Supplements (JUICE PLUS FIBRE) LIQD Take by mouth.  . Zinc 50 MG CAPS Take 1 capsule by mouth daily.   No Known Allergies   Past Medical History:  Diagnosis Date  . Prediabetes   . Vitamin D deficiency    Review of Systems  10 point systems review negative except as above.    Objective:   Physical Exam  BP 114/66   Pulse 72   Temp (!) 97.3 F (36.3 C)   Resp 16   Ht 5' 9"  (1.753 m)   Wt 165 lb 9.6 oz (75.1 kg)   BMI 24.45 kg/m   HEENT - WNL. Neck - supple.  Chest - Clear equal BS. Cor - Nl HS. RRR w/o sig MGR. PP 1(+). No edema. GU - No Hernia. Teste - WNL. DRE w/ Nl rectal tone . Prostate is 1+, Sl boggy.  MS- FROM w/o deformities.  Gait Nl. Neuro -  Nl w/o focal abnormalities.    Assessment & Plan:   1. Acute prostatitis  - Urinalysis, Routine w reflex microscopic - Urine Culture  - ciprofloxacin (CIPRO) 500 MG tablet; Take 1 tablet 2 x / day with food for Prostate Infection  Dispense: 60 tablet  - tamsulosin (FLOMAX) 0.4 MG CAPS capsule; Take 1 capsule at bedtime for prostate  Dispense: 30 capsule; Refill: 2  2. Need for immunization  against influenza  - FLU VACCINE MDCK QUAD W/Preservative

## 2017-05-01 NOTE — Patient Instructions (Signed)
Prostatitis Prostatitis is swelling or inflammation of the prostate gland. The prostate is a walnut-sized gland that is involved in the production of semen. It is located below a man's bladder, in front of the rectum. There are four types of prostatitis:  Chronic nonbacterial prostatitis. This is the most common type of prostatitis. It may be associated with a viral infection or autoimmune disorder.  Acute bacterial prostatitis. This is the least common type of prostatitis. It starts quickly and is usually associated with a bladder infection, high fever, and shaking chills. It can occur at any age.  Chronic bacterial prostatitis. This type usually results from acute bacterial prostatitis that happens repeatedly (is recurrent) or has not been treated properly. It can occur in men of any age but is most common among middle-aged men whose prostate has begun to get larger. The symptoms are not as severe as symptoms caused by acute bacterial prostatitis.  Prostatodynia or chronic pelvic pain syndrome (CPPS). This type is also called pelvic floor disorder. It is associated with increased muscular tone in the pelvis surrounding the prostate. What are the causes? Bacterial prostatitis is caused by infection from bacteria. Chronic nonbacterial prostatitis may be caused by:  Urinary tract infections (UTIs).  Nerve damage.  A response by the body's disease-fighting system (autoimmune response).  Chemicals in the urine. The causes of the other types of prostatitis are usually not known. What are the signs or symptoms? Symptoms of this condition vary depending upon the type of prostatitis. If you have acute bacterial prostatitis, you may experience:  Urinary symptoms, such as:  Painful urination.  Burning during urination.  Frequent and sudden urges to urinate.  Inability to start urinating.  A weak or interrupted stream of urine.  Vomiting.  Nausea.  Fever.  Chills.  Inability to  empty the bladder completely.  Pain in the:  Muscles or joints.  Lower back.  Lower abdomen. If you have any of the other types of prostatitis, you may experience:  Urinary symptoms, such as:  Sudden urges to urinate.  Frequent urination.  Difficulty starting urination.  Weak urine stream.  Dribbling after urination.  Discharge from the urethra. The urethra is a tube that opens at the end of the penis.  Pain in the:  Testicles.  Penis or tip of the penis.  Rectum.  Area in front of the rectum and below the scrotum (perineum).  Problems with sexual function.  Painful ejaculation.  Bloody semen. How is this diagnosed? This condition may be diagnosed based on:  A physical and medical exam.  Your symptoms.  A urine test to check for bacteria.  An exam in which a health care provider uses a finger to feel the prostate (digital rectal exam).  A test of a sample of semen.  Blood tests.  Ultrasound.  Removal of prostate tissue to be examined under a microscope (biopsy).  Tests to check how your body handles urine (urodynamic tests).  A test to look inside your bladder or urethra (cystoscopy). How is this treated? Treatment for this condition depends on the type of prostatitis. Treatment may involve:  Medicines to relieve pain or inflammation.  Medicines to help relax your muscles.  Physical therapy.  Heat therapy.  Techniques to help you control certain body functions (biofeedback).  Relaxation exercises.  Antibiotic medicine, if your condition is caused by bacteria.  Warm water baths (sitz baths). Sitz baths help with relaxing your pelvic floor muscles, which helps to relieve pressure on the prostate. Follow   these instructions at home:  Take over-the-counter and prescription medicines only as told by your health care provider.  If you were prescribed an antibiotic, take it as told by your health care provider. Do not stop taking the  antibiotic even if you start to feel better.  If physical therapy, biofeedback, or relaxation exercises were prescribed, do exercises as instructed.  Take sitz baths as directed by your health care provider. For a sitz bath, sit in warm water that is deep enough to cover your hips and buttocks.  Keep all follow-up visits as told by your health care provider. This is important. Contact a health care provider if:  Your symptoms get worse.  You have a fever. Get help right away if:  You have chills.  You feel nauseous.  You vomit.  You feel light-headed or feel like you are going to faint.  You are unable to urinate.  You have blood or blood clots in your urine. This information is not intended to replace advice given to you by your health care provider. Make sure you discuss any questions you have with your health care provider. Document Released: 05/25/2000 Document Revised: 02/16/2016 Document Reviewed: 02/16/2016 Elsevier Interactive Patient Education  2017 [employer]vier Inc[employer]  

## 2017-05-02 ENCOUNTER — Encounter: Payer: Self-pay | Admitting: Internal Medicine

## 2017-05-02 LAB — URINALYSIS, ROUTINE W REFLEX MICROSCOPIC
Bilirubin Urine: NEGATIVE
GLUCOSE, UA: NEGATIVE
HGB URINE DIPSTICK: NEGATIVE
Ketones, ur: NEGATIVE
LEUKOCYTES UA: NEGATIVE
NITRITE: NEGATIVE
PH: 8 (ref 5.0–8.0)
Protein, ur: NEGATIVE
SPECIFIC GRAVITY, URINE: 1.017 (ref 1.001–1.03)

## 2017-05-02 LAB — URINE CULTURE
MICRO NUMBER: 81316018
RESULT: NO GROWTH
SPECIMEN QUALITY:: ADEQUATE

## 2017-06-24 ENCOUNTER — Other Ambulatory Visit: Payer: Self-pay | Admitting: *Deleted

## 2017-06-24 DIAGNOSIS — N410 Acute prostatitis: Secondary | ICD-10-CM

## 2017-06-24 MED ORDER — TAMSULOSIN HCL 0.4 MG PO CAPS: ORAL_CAPSULE | ORAL | 0 refills | Status: DC

## 2017-10-01 ENCOUNTER — Other Ambulatory Visit: Payer: Self-pay | Admitting: Internal Medicine

## 2017-10-01 DIAGNOSIS — N410 Acute prostatitis: Secondary | ICD-10-CM

## 2017-10-17 ENCOUNTER — Ambulatory Visit (INDEPENDENT_AMBULATORY_CARE_PROVIDER_SITE_OTHER): Payer: Managed Care, Other (non HMO) | Admitting: Internal Medicine

## 2017-10-17 VITALS — BP 124/78 | HR 68 | Temp 97.0°F | Resp 16 | Ht 68.5 in | Wt 158.8 lb

## 2017-10-17 DIAGNOSIS — E559 Vitamin D deficiency, unspecified: Secondary | ICD-10-CM

## 2017-10-17 DIAGNOSIS — R350 Frequency of micturition: Secondary | ICD-10-CM | POA: Diagnosis not present

## 2017-10-17 DIAGNOSIS — Z79899 Other long term (current) drug therapy: Secondary | ICD-10-CM

## 2017-10-17 DIAGNOSIS — E782 Mixed hyperlipidemia: Secondary | ICD-10-CM

## 2017-10-17 DIAGNOSIS — Z0001 Encounter for general adult medical examination with abnormal findings: Secondary | ICD-10-CM

## 2017-10-17 DIAGNOSIS — R0989 Other specified symptoms and signs involving the circulatory and respiratory systems: Secondary | ICD-10-CM

## 2017-10-17 DIAGNOSIS — Z1212 Encounter for screening for malignant neoplasm of rectum: Secondary | ICD-10-CM

## 2017-10-17 DIAGNOSIS — Z1329 Encounter for screening for other suspected endocrine disorder: Secondary | ICD-10-CM | POA: Diagnosis not present

## 2017-10-17 DIAGNOSIS — Z0000 Encounter for general adult medical examination without abnormal findings: Secondary | ICD-10-CM

## 2017-10-17 DIAGNOSIS — Z111 Encounter for screening for respiratory tuberculosis: Secondary | ICD-10-CM

## 2017-10-17 DIAGNOSIS — G8929 Other chronic pain: Secondary | ICD-10-CM

## 2017-10-17 DIAGNOSIS — Z1211 Encounter for screening for malignant neoplasm of colon: Secondary | ICD-10-CM

## 2017-10-17 DIAGNOSIS — E349 Endocrine disorder, unspecified: Secondary | ICD-10-CM

## 2017-10-17 DIAGNOSIS — Z125 Encounter for screening for malignant neoplasm of prostate: Secondary | ICD-10-CM | POA: Diagnosis not present

## 2017-10-17 DIAGNOSIS — N401 Enlarged prostate with lower urinary tract symptoms: Secondary | ICD-10-CM | POA: Diagnosis not present

## 2017-10-17 DIAGNOSIS — Z130 Encounter for screening for diseases of the blood and blood-forming organs and certain disorders involving the immune mechanism: Secondary | ICD-10-CM | POA: Diagnosis not present

## 2017-10-17 DIAGNOSIS — Z13220 Encounter for screening for lipoid disorders: Secondary | ICD-10-CM | POA: Diagnosis not present

## 2017-10-17 DIAGNOSIS — Z131 Encounter for screening for diabetes mellitus: Secondary | ICD-10-CM | POA: Diagnosis not present

## 2017-10-17 DIAGNOSIS — R7303 Prediabetes: Secondary | ICD-10-CM

## 2017-10-17 DIAGNOSIS — Z1389 Encounter for screening for other disorder: Secondary | ICD-10-CM | POA: Diagnosis not present

## 2017-10-17 DIAGNOSIS — M25512 Pain in left shoulder: Secondary | ICD-10-CM

## 2017-10-17 DIAGNOSIS — R5383 Other fatigue: Secondary | ICD-10-CM

## 2017-10-17 NOTE — Patient Instructions (Signed)

## 2017-10-17 NOTE — Progress Notes (Signed)
[facility]   Unk Pinto, M.D.     Uvaldo Bristle. Silverio Lay, P.A.-C Liane Comber, [place]                [address] [place], [place] [address] Telephone [phone] Telefax [phone] Annual  Screening/Preventative Visit  & Comprehensive Evaluation & Examination     This very nice 42 y.o. MBM presents for a Screening/Preventative Visit & comprehensive evaluation and management of multiple medical co-morbidities.  Patient has been followed for HTN, HLD, Prediabetes and Vitamin D Deficiency.     Patient is followed for hx/o labile HTN. Patient's BP has been controlled at home.  Today's BP is at goal - 124/78. Patient denies any cardiac symptoms as chest pain, palpitations, shortness of breath, dizziness or ankle swelling.     Patient's hyperlipidemia is controlled with diet. Last lipids were at goal: Lab Results  Component Value Date   CHOL 155 09/25/2016   HDL 43 09/25/2016   LDLCALC 97 09/25/2016   TRIG 74 09/25/2016   CHOLHDL 3.6 09/25/2016      Patient has prediabetes (A1c 6.1%/2011 & then 5.7%/2013) and patient denies reactive hypoglycemic symptoms, visual blurring, diabetic polys or paresthesias. Last A1c was Normal & at goal: Lab Results  Component Value Date   HGBA1C 5.3 09/25/2016       Patient has hx/o Low T -  "222"/2016 and "306"/2015 and has declined Tx in the past. Finally, patient has history of Vitamin D Deficiency ("26"/2010) and last vitamin D was sl low  (goal 70-100): Lab Results  Component Value Date   VD25OH 54 09/25/2016   Current Outpatient Medications on File Prior to Visit  Medication Sig  . aspirin EC 81 MG tablet Take 81 mg by mouth daily.  Marland Kitchen b complex vitamins capsule Take 1 capsule by mouth daily.  . Cholecalciferol (VITAMIN D PO) Take 8,000 Int'l Units by mouth daily.  . Cinnamon 500 MG capsule Take 500 mg by mouth daily.  . meloxicam (MOBIC) 15 MG  tablet Take 15 mg by mouth daily.  . tamsulosin (FLOMAX) 0.4 MG CAPS capsule TAKE 1 CAPSULE AT BEDTIME FOR PROSTATE  . Zinc 50 MG CAPS Take 1 capsule by mouth daily.   No current facility-administered medications on file prior to visit.    No Known Allergies Past Medical History:  Diagnosis Date  . Prediabetes   . Vitamin D deficiency    Health Maintenance  Topic Date Due  . INFLUENZA VACCINE  01/09/2018  . TETANUS/TDAP  07/13/2025  . HIV Screening  Completed   Immunization History  Administered Date(s) Administered  . Influenza Inj Mdck Quad With Preservative 05/01/2017  . PPD Test 08/28/2013, 07/06/2014, 09/25/2016  . Pneumococcal Polysaccharide-23 04/28/2008  . Td 06/11/2005  . Tdap 07/14/2015   Last Colon -  Past Surgical History:  Procedure Laterality Date  . NO PAST SURGERIES     Family History  Problem Relation Age of Onset  . Diabetes Mother   . Cancer Mother        Breast  . Hypertension Father   . Hyperlipidemia Father   . Cancer Father        Prostate  . Cancer Maternal Grandmother        lung  . Cancer Maternal Grandfather        lung   Social History   Socioeconomic  History  . Marital status: Single    Spouse name: Not on file  . Number of children: Not on file  . Years of education: Not on file  . 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 Smoking status: Never Smoker  . Smokeless tobacco: Never Used  Substance and Sexual Activity  . Alcohol use: Yes    Comment: ocassional  . Drug use: No  . Sexual activity: Not on file    ROS Constitutional: Denies fever, chills, weight loss/gain, headaches, insomnia,  night sweats or change in appetite. Does c/o fatigue. Eyes: Denies redness, blurred vision, diplopia, discharge, itchy or watery eyes.  ENT: Denies discharge, congestion, post nasal drip, epistaxis, sore throat, earache, hearing loss, dental pain, Tinnitus, Vertigo, Sinus pain or  snoring.  Cardio: Denies chest pain, palpitations, irregular heartbeat, syncope, dyspnea, diaphoresis, orthopnea, PND, claudication or edema Respiratory: denies cough, dyspnea, DOE, pleurisy, hoarseness, laryngitis or wheezing.  Gastrointestinal: Denies dysphagia, heartburn, reflux, water brash, pain, cramps, nausea, vomiting, bloating, diarrhea, constipation, hematemesis, melena, hematochezia, jaundice or hemorrhoids Genitourinary: Denies dysuria, frequency, urgency, nocturia, hesitancy, discharge, hematuria or flank pain Musculoskeletal: Denies arthralgia, myalgia, stiffness, Jt. Swelling, pain, limp or strain/sprain. Denies Falls. Skin: Denies puritis, rash, hives, warts, acne, eczema or change in skin lesion Neuro: No weakness, tremor, incoordination, spasms, paresthesia or pain Psychiatric: Denies confusion, memory loss or sensory loss. Denies Depression. Endocrine: Denies change in weight, skin, hair change, nocturia, and paresthesia, diabetic polys, visual blurring or hyper / hypo glycemic episodes.  Heme/Lymph: No excessive bleeding, bruising or enlarged lymph nodes.  Physical Exam  BP 124/78   Pulse 68   Temp (!) 97 F (36.1 C)   Resp 16   Ht 5' 8.5" (1.74 m)   Wt 158 lb 12.8 oz (72 kg)   BMI 23.79 kg/m   General Appearance: Well nourished and well groomed and in no apparent distress.  Eyes: PERRLA, EOMs, conjunctiva no swelling or erythema, normal fundi and vessels. Sinuses: No frontal/maxillary tenderness ENT/Mouth: EACs patent / TMs  nl. Nares clear without erythema, swelling, mucoid exudates. Oral hygiene is good. No erythema, swelling, or exudate. Tongue normal, non-obstructing. Tonsils not swollen or erythematous. Hearing normal.  Neck: Supple, thyroid not palpable. No bruits, nodes or JVD. Respiratory: Respiratory effort normal.  BS equal and clear bilateral without rales, rhonci, wheezing or stridor. Cardio: Heart sounds are normal with regular rate and rhythm and no  murmurs, rubs or gallops. Peripheral pulses are normal and equal bilaterally without edema. No aortic or femoral bruits. Chest: symmetric with normal excursions and percussion.  Abdomen: Soft, with Nl bowel sounds. Nontender, no guarding, rebound, hernias, masses, or organomegaly.  Lymphatics: Non tender without lymphadenopathy.  Genitourinary: No hernias.Testes nl. DRE - prostate nl for age - smooth & firm w/o nodules. Musculoskeletal: Full ROM all peripheral extremities, joint stability, 5/5 strength, and normal gait. Skin: Warm and dry without rashes, lesions, cyanosis, clubbing or  ecchymosis.  Neuro: Cranial nerves intact, reflexes equal bilaterally. Normal muscle tone, no cerebellar symptoms. Sensation intact.  Pysch: Alert and oriented X 3 with normal affect, insight and judgment appropriate.   Assessment and Plan  1. Annual Preventative/Screening Exam  2. Hypertension  3. Hyperlipidemia 4. Pre Diabetes 5. Vitamin D Deficiency           Patient was counseled in prudent diet, weight control to achieve/maintain BMI less than 25, BP monitoring, regular exercise and medications as discussed.  Discussed med effects and SE's. Routine screening labs and tests as requested with regular follow-up as recommended. Over 40 minutes of exam, counseling, chart review and high complex critical decision making was performed

## 2017-10-18 DIAGNOSIS — Z111 Encounter for screening for respiratory tuberculosis: Secondary | ICD-10-CM

## 2017-10-19 ENCOUNTER — Encounter: Payer: Self-pay | Admitting: Internal Medicine

## 2017-10-19 LAB — COMPLETE METABOLIC PANEL WITH GFR
AG RATIO: 1.9 (calc) (ref 1.0–2.5)
ALT: 23 U/L (ref 9–46)
AST: 20 U/L (ref 10–40)
Albumin: 4.8 g/dL (ref 3.6–5.1)
Alkaline phosphatase (APISO): 55 U/L (ref 40–115)
BILIRUBIN TOTAL: 0.7 mg/dL (ref 0.2–1.2)
BUN: 13 mg/dL (ref 7–25)
CO2: 31 mmol/L (ref 20–32)
Calcium: 9.8 mg/dL (ref 8.6–10.3)
Chloride: 103 mmol/L (ref 98–110)
Creat: 0.86 mg/dL (ref 0.60–1.35)
GFR, EST NON AFRICAN AMERICAN: 108 mL/min/{1.73_m2} (ref >=60)
GFR, Est African American: 126 mL/min/{1.73_m2} (ref >=60)
GLOBULIN: 2.5 g/dL (ref 1.9–3.7)
Glucose, Bld: 96 mg/dL (ref 65–99)
POTASSIUM: 5 mmol/L (ref 3.5–5.3)
SODIUM: 139 mmol/L (ref 135–146)
Total Protein: 7.3 g/dL (ref 6.1–8.1)

## 2017-10-19 LAB — IRON, TOTAL/TOTAL IRON BINDING CAP
%SAT: 42 % (calc) (ref 15–60)
IRON: 139 ug/dL (ref 50–180)
TIBC: 329 mcg/dL (calc) (ref 250–425)

## 2017-10-19 LAB — LIPID PANEL
CHOL/HDL RATIO: 4.8 (calc) (ref <5.0)
CHOLESTEROL: 172 mg/dL (ref <200)
HDL: 36 mg/dL — ABNORMAL LOW (ref >40)
LDL Cholesterol (Calc): 115 mg/dL (calc) — ABNORMAL HIGH
Non-HDL Cholesterol (Calc): 136 mg/dL (calc) — ABNORMAL HIGH (ref <130)
Triglycerides: 106 mg/dL (ref <150)

## 2017-10-19 LAB — CBC WITH DIFFERENTIAL/PLATELET
BASOS ABS: 18 {cells}/uL (ref 0–200)
Basophils Relative: 0.3 %
Eosinophils Absolute: 207 cells/uL (ref 15–500)
Eosinophils Relative: 3.5 %
HEMATOCRIT: 44.2 % (ref 38.5–50.0)
Hemoglobin: 15 g/dL (ref 13.2–17.1)
LYMPHS ABS: 2053 {cells}/uL (ref 850–3900)
MCH: 28.8 pg (ref 27.0–33.0)
MCHC: 33.9 g/dL (ref 32.0–36.0)
MCV: 84.8 fL (ref 80.0–100.0)
MPV: 9.1 fL (ref 7.5–12.5)
Monocytes Relative: 6.9 %
NEUTROS PCT: 54.5 %
Neutro Abs: 3216 cells/uL (ref 1500–7800)
Platelets: 306 10*3/uL (ref 140–400)
RBC: 5.21 10*6/uL (ref 4.20–5.80)
RDW: 12.3 % (ref 11.0–15.0)
Total Lymphocyte: 34.8 %
WBC mixed population: 407 cells/uL (ref 200–950)
WBC: 5.9 10*3/uL (ref 3.8–10.8)

## 2017-10-19 LAB — URINALYSIS, ROUTINE W REFLEX MICROSCOPIC
BILIRUBIN URINE: NEGATIVE
GLUCOSE, UA: NEGATIVE
HGB URINE DIPSTICK: NEGATIVE
Ketones, ur: NEGATIVE
Leukocytes, UA: NEGATIVE
Nitrite: NEGATIVE
PROTEIN: NEGATIVE
Specific Gravity, Urine: 1.023 (ref 1.001–1.03)
pH: 5.5 (ref 5.0–8.0)

## 2017-10-19 LAB — VITAMIN B12: Vitamin B-12: 439 pg/mL (ref 200–1100)

## 2017-10-19 LAB — PSA: PSA: 0.3 ng/mL (ref <=4.0)

## 2017-10-19 LAB — TSH: TSH: 1.33 mIU/L (ref 0.40–4.50)

## 2017-10-19 LAB — TESTOSTERONE: Testosterone: 428 ng/dL (ref 250–827)

## 2017-10-19 LAB — HEMOGLOBIN A1C
HEMOGLOBIN A1C: 5.5 %{Hb} (ref <5.7)
Mean Plasma Glucose: 111 (calc)
eAG (mmol/L): 6.2 (calc)

## 2017-10-19 LAB — MICROALBUMIN / CREATININE URINE RATIO
Creatinine, Urine: 168 mg/dL (ref 20–320)
Microalb Creat Ratio: 4 mcg/mg creat (ref <30)
Microalb, Ur: 0.6 mg/dL

## 2017-10-19 LAB — TEST AUTHORIZATION

## 2017-10-19 LAB — VITAMIN D 25 HYDROXY (VIT D DEFICIENCY, FRACTURES): VIT D 25 HYDROXY: 77 ng/mL (ref 30–100)

## 2017-10-19 LAB — INSULIN, RANDOM: INSULIN: 4.6 u[IU]/mL (ref 2.0–19.6)

## 2017-10-19 LAB — MAGNESIUM: Magnesium: 2 mg/dL (ref 1.5–2.5)

## 2018-01-09 ENCOUNTER — Other Ambulatory Visit: Payer: Self-pay | Admitting: Internal Medicine

## 2018-01-09 DIAGNOSIS — N410 Acute prostatitis: Secondary | ICD-10-CM

## 2018-03-12 ENCOUNTER — Ambulatory Visit: Payer: Managed Care, Other (non HMO) | Admitting: Internal Medicine

## 2018-03-12 ENCOUNTER — Encounter: Payer: Self-pay | Admitting: Internal Medicine

## 2018-03-12 VITALS — BP 106/64 | HR 72 | Temp 97.3°F | Resp 18 | Ht 68.5 in | Wt 162.6 lb

## 2018-03-12 DIAGNOSIS — J4541 Moderate persistent asthma with (acute) exacerbation: Secondary | ICD-10-CM

## 2018-03-12 MED ORDER — PREDNISONE 20 MG PO TABS: ORAL_TABLET | ORAL | 0 refills | Status: DC

## 2018-03-12 MED ORDER — LEVOFLOXACIN 500 MG PO TABS: ORAL_TABLET | ORAL | 1 refills | Status: DC

## 2018-03-12 MED ORDER — BENZONATATE 200 MG PO CAPS: ORAL_CAPSULE | ORAL | 1 refills | Status: DC

## 2018-03-16 ENCOUNTER — Encounter: Payer: Self-pay | Admitting: Internal Medicine

## 2018-03-16 NOTE — Progress Notes (Signed)
  Subjective:    Patient ID: John Velez, male    DOB: 1976-04-09, 42 y.o.   MRN: [id number]  HPI   Patient is a nice 42 yo MBM [facility] with a Z-Pak ans an inhaler ans he still is feeling sick & coughing productively. Denies fever, chill, rash or dyspnea.   Medication Sig  . aspirin EC 81 MG tablet Take 81 mg by mouth daily.  Marland Kitchen b complex vitamins capsule Take 1 capsule by mouth daily.  . Cholecalciferol (VITAMIN D PO) Take 8,000 Int'l Units by mouth daily.  . Cinnamon 500 MG capsule Take 500 mg by mouth daily.  . Zinc 50 MG CAPS Take 1 capsule by mouth daily.  . meloxicam (MOBIC) 15 MG tablet Take 15 mg by mouth daily.  . tamsulosin (FLOMAX) 0.4 MG CAPS capsule TAKE 1 CAPSULE AT BEDTIME FOR PROSTATE   No Known Allergies  Past Medical History:  Diagnosis Date  . Prediabetes   . Vitamin D deficiency    Review of Systems   10 point systems review negative except as above.    Objective:   Physical Exam  BP 106/64   Pulse 72   Temp (!) 97.3 F (36.3 C)   Resp 18   Ht 5' 8.5" (1.74 m)   Wt 162 lb 9.6 oz (73.8 kg)   BMI 24.36 kg/m   Congested wheezy cough. No Stridor. O2 sat 98%  HEENT - WNL. Neck - supple.  Chest - Scattered coarse inspiratory rales and sonorous expiratory coarse rhonchi & wheezes Cor - Nl HS. RRR w/o sig MGR. PP 1(+). No edema. MS- FROM w/o deformities.  Gait Nl. Neuro -  Nl w/o focal abnormalities. Skin - clear    Assessment & Plan:   1. Moderate persistent asthmatic bronchitis with acute exacerbation  - predniSONE (DELTASONE) 20 MG tablet; 1 tab 3 x day for 3 days, then 1 tab 2 x day for 3 days, then 1 tab 1 x day for 5 days  Disp: 20 tablet  - levofloxacin (LEVAQUIN) 500 MG tablet; Take 1 tablet daily with food for infection  Disp: 10 tablet; Refill: 1  - benzonatate (TESSALON) 200 MG capsule; Take 1 perle 3 x / day to prevent cough  Disp: 30 capsule; Refill: 1  - Sx - Trelegy & instructed  in use

## 2018-03-16 NOTE — Patient Instructions (Signed)

## 2018-04-24 NOTE — Progress Notes (Signed)
FOLLOW UP  Assessment and Plan:   Hypertension -Continue medication, monitor blood pressure at home. Continue DASH diet.  Reminder to go to the ER if any CP, SOB, nausea, dizziness, severe HA, changes vision/speech, left arm numbness and tingling and jaw pain.  Cholesterol -Continue diet and exercise. Check cholesterol.    Prediabetes  -Continue diet and exercise. Check A1C  Vitamin D Def - check level and continue medications.   Continue diet and meds as discussed. Further disposition pending results of labs. Over 30 minutes of exam, counseling, chart review, and critical decision making was performed  Future Appointments  Date Time Provider [place]  11/12/2018  9:00 AM Unk Pinto, MD [facility] None     HPI 42 y.o. male  presents for 6 month follow up on hypertension, cholesterol, prediabetes, and vitamin D deficiency.   His blood pressure has been controlled at home, today their BP is BP: 112/76   He does workout, yoga and wants to start jogging again. He denies chest pain, shortness of breath, dizziness.   He  is not  on cholesterol medication and denies myalgias. His cholesterol is at goal. The cholesterol last visit was:   Lab Results  Component Value Date   CHOL 172 10/17/2017   HDL 36 (L) 10/17/2017   LDLCALC 115 (H) 10/17/2017   TRIG 106 10/17/2017   CHOLHDL 4.8 10/17/2017    He has been working on diet and exercise for prediabetes, and denies paresthesia of the feet, polydipsia, polyuria and visual disturbances. Last A1C in the office was:  Lab Results  Component Value Date   HGBA1C 5.5 10/17/2017   Patient is on Vitamin D supplement.   Lab Results  Component Value Date   VD25OH 77 10/17/2017     BMI is Body mass index is 25.73 kg/m., he is working on diet and exercise. Wt Readings from Last 3 Encounters:  04/25/18 169 lb 3.2 oz (76.7 kg)  03/12/18 162 lb 9.6 oz (73.8 kg)  10/17/17 158 lb 12.8 oz (72 kg)      Current Medications:   Current Outpatient Medications on File Prior to Visit  Medication Sig  . aspirin EC 81 MG tablet Take 81 mg by mouth daily.  Marland Kitchen b complex vitamins capsule Take 1 capsule by mouth daily.  . Cholecalciferol (VITAMIN D PO) Take 8,000 Int'l Units by mouth daily.  . Cinnamon 500 MG capsule Take 500 mg by mouth daily.  . Zinc 50 MG CAPS Take 1 capsule by mouth daily.   No current facility-administered medications on file prior to visit.     Medical History:  Past Medical History:  Diagnosis Date  . Prediabetes   . Vitamin D deficiency    Allergies: No Known Allergies   Review of Systems:  ROS  Family history- Review and unchanged Social history- Review and unchanged Physical Exam: BP 112/76   Pulse 77   Temp 98.4 F (36.9 C)   Resp 16   Ht 5' 8"  (1.727 m)   Wt 169 lb 3.2 oz (76.7 kg)   SpO2 96%   BMI 25.73 kg/m  Wt Readings from Last 3 Encounters:  04/25/18 169 lb 3.2 oz (76.7 kg)  03/12/18 162 lb 9.6 oz (73.8 kg)  10/17/17 158 lb 12.8 oz (72 kg)   General Appearance: Well nourished, in no apparent distress. Eyes: PERRLA, EOMs, conjunctiva no swelling or erythema Sinuses: No Frontal/maxillary tenderness ENT/Mouth: Ext aud canals clear, TMs without erythema, bulging. No erythema, swelling, or exudate  on post pharynx.  Tonsils not swollen or erythematous. Hearing normal.  Neck: Supple, thyroid normal.  Respiratory: Respiratory effort normal, BS equal bilaterally without rales, rhonchi, wheezing or stridor.  Cardio: RRR with no MRGs. Brisk peripheral pulses without edema.  Abdomen: Soft, + BS,  Non tender, no guarding, rebound, hernias, masses. Lymphatics: Non tender without lymphadenopathy.  Musculoskeletal: Full ROM, 5/5 strength, Normal gait Skin: Warm, dry without rashes, lesions, ecchymosis.  Neuro: Cranial nerves intact. Normal muscle tone, no cerebellar symptoms. Psych: Awake and oriented X 3, normal affect, Insight and Judgment appropriate.    Vicie Mutters,  PA-C 8:58 AM [facility]

## 2018-04-25 ENCOUNTER — Ambulatory Visit: Payer: Managed Care, Other (non HMO) | Admitting: Physician Assistant

## 2018-04-25 ENCOUNTER — Encounter: Payer: Self-pay | Admitting: Physician Assistant

## 2018-04-25 VITALS — BP 112/76 | HR 77 | Temp 98.4°F | Resp 16 | Ht 68.0 in | Wt 169.2 lb

## 2018-04-25 DIAGNOSIS — Z23 Encounter for immunization: Secondary | ICD-10-CM

## 2018-04-25 DIAGNOSIS — R7303 Prediabetes: Secondary | ICD-10-CM

## 2018-04-25 DIAGNOSIS — E782 Mixed hyperlipidemia: Secondary | ICD-10-CM

## 2018-04-25 DIAGNOSIS — I10 Essential (primary) hypertension: Secondary | ICD-10-CM | POA: Diagnosis not present

## 2018-04-25 DIAGNOSIS — Z79899 Other long term (current) drug therapy: Secondary | ICD-10-CM

## 2018-04-25 NOTE — Patient Instructions (Signed)
VITAMIN D IS IMPORTANT  Vitamin D goal is between 60-80  Please make sure that you are taking your Vitamin D as directed.   It is very important as a natural anti-inflammatory   helping hair, skin, and nails, as well as reducing stroke and heart attack risk.   It helps your bones and helps with mood.  We want you on at least 5000 IU daily  It also decreases numerous cancer risks so please take it as directed.   Low Vit D is associated with a 200-300% higher risk for CANCER   and 200-300% higher risk for HEART   ATTACK  &  STROKE.    .....................................Marland Kitchen  It is also associated with higher death rate at younger ages,   autoimmune diseases like Rheumatoid arthritis, Lupus, Multiple Sclerosis.     Also many other serious conditions, like depression, Alzheimer's  Dementia, infertility, muscle aches, fatigue, fibromyalgia - just to name a few.  +++++++++++++++++++  Can get liquid vitamin D from [place] here in [place] at  [facility] alternatives [address] [place], [place] [address] Or you can try earth fare

## 2018-11-12 ENCOUNTER — Ambulatory Visit (INDEPENDENT_AMBULATORY_CARE_PROVIDER_SITE_OTHER): Payer: Managed Care, Other (non HMO) | Admitting: Internal Medicine

## 2018-11-12 ENCOUNTER — Encounter: Payer: Self-pay | Admitting: Internal Medicine

## 2018-11-12 ENCOUNTER — Other Ambulatory Visit: Payer: Self-pay

## 2018-11-12 VITALS — BP 100/64 | HR 72 | Temp 97.0°F | Resp 16 | Ht 69.0 in | Wt 171.6 lb

## 2018-11-12 DIAGNOSIS — E782 Mixed hyperlipidemia: Secondary | ICD-10-CM

## 2018-11-12 DIAGNOSIS — E559 Vitamin D deficiency, unspecified: Secondary | ICD-10-CM | POA: Diagnosis not present

## 2018-11-12 DIAGNOSIS — Z13220 Encounter for screening for lipoid disorders: Secondary | ICD-10-CM

## 2018-11-12 DIAGNOSIS — Z1389 Encounter for screening for other disorder: Secondary | ICD-10-CM

## 2018-11-12 DIAGNOSIS — Z1329 Encounter for screening for other suspected endocrine disorder: Secondary | ICD-10-CM | POA: Diagnosis not present

## 2018-11-12 DIAGNOSIS — I10 Essential (primary) hypertension: Secondary | ICD-10-CM

## 2018-11-12 DIAGNOSIS — Z79899 Other long term (current) drug therapy: Secondary | ICD-10-CM | POA: Diagnosis not present

## 2018-11-12 DIAGNOSIS — Z8249 Family history of ischemic heart disease and other diseases of the circulatory system: Secondary | ICD-10-CM

## 2018-11-12 DIAGNOSIS — Z125 Encounter for screening for malignant neoplasm of prostate: Secondary | ICD-10-CM

## 2018-11-12 DIAGNOSIS — R350 Frequency of micturition: Secondary | ICD-10-CM

## 2018-11-12 DIAGNOSIS — Z0000 Encounter for general adult medical examination without abnormal findings: Secondary | ICD-10-CM

## 2018-11-12 DIAGNOSIS — R7309 Other abnormal glucose: Secondary | ICD-10-CM

## 2018-11-12 DIAGNOSIS — Z111 Encounter for screening for respiratory tuberculosis: Secondary | ICD-10-CM | POA: Diagnosis not present

## 2018-11-12 DIAGNOSIS — N401 Enlarged prostate with lower urinary tract symptoms: Secondary | ICD-10-CM | POA: Diagnosis not present

## 2018-11-12 DIAGNOSIS — Z130 Encounter for screening for diseases of the blood and blood-forming organs and certain disorders involving the immune mechanism: Secondary | ICD-10-CM | POA: Diagnosis not present

## 2018-11-12 DIAGNOSIS — B351 Tinea unguium: Secondary | ICD-10-CM

## 2018-11-12 DIAGNOSIS — Z131 Encounter for screening for diabetes mellitus: Secondary | ICD-10-CM

## 2018-11-12 DIAGNOSIS — Z20822 Contact with and (suspected) exposure to covid-19: Secondary | ICD-10-CM

## 2018-11-12 DIAGNOSIS — R7303 Prediabetes: Secondary | ICD-10-CM

## 2018-11-12 DIAGNOSIS — E349 Endocrine disorder, unspecified: Secondary | ICD-10-CM

## 2018-11-12 DIAGNOSIS — Z1211 Encounter for screening for malignant neoplasm of colon: Secondary | ICD-10-CM

## 2018-11-12 DIAGNOSIS — Z20828 Contact with and (suspected) exposure to other viral communicable diseases: Secondary | ICD-10-CM

## 2018-11-12 DIAGNOSIS — Z0001 Encounter for general adult medical examination with abnormal findings: Secondary | ICD-10-CM

## 2018-11-12 DIAGNOSIS — R0989 Other specified symptoms and signs involving the circulatory and respiratory systems: Secondary | ICD-10-CM

## 2018-11-12 DIAGNOSIS — Z136 Encounter for screening for cardiovascular disorders: Secondary | ICD-10-CM

## 2018-11-12 DIAGNOSIS — R5383 Other fatigue: Secondary | ICD-10-CM

## 2018-11-12 LAB — SAR COV2 SEROLOGY (COVID19)AB(IGG),IA: SARS CoV2 AB IGG: NEGATIVE

## 2018-11-12 MED ORDER — TERBINAFINE HCL 250 MG PO TABS: ORAL_TABLET | ORAL | 0 refills | Status: DC

## 2018-11-12 NOTE — Patient Instructions (Signed)
- Vit D  And Vit C 1,000 mg  are recommended to help protect  against the Covid_19 and other Corona viruses.   - Also it's recommended to take Zinc 50 mg to help  protect against the Covid_19  And best place to get  is also on [email or web address] and don't pay more than 6-8 cents /pill !   =============================== Coronavirus (COVID-19) Are you at risk?  Are you at risk for the Coronavirus (COVID-19)?  To be considered HIGH RISK for Coronavirus (COVID-19), you have to meet the following criteria:  . Traveled to [place], [place], [place], [place] or [place]; or in the [place] to [place], [place], [place]  . or [place]; and have fever, cough, and shortness of breath within the last 2 weeks of travel OR . Been in close contact with a person diagnosed with COVID-19 within the last 2 weeks and have  . fever, cough,and shortness of breath .  . IF YOU DO NOT MEET THESE CRITERIA, YOU ARE CONSIDERED LOW RISK FOR COVID-19.  What to do if you are HIGH RISK for COVID-19?  Marland Kitchen If you are having a medical emergency, call 911. . Seek medical care right away. Before you go to a doctor's office, urgent care or emergency department, .  call ahead and tell them about your recent travel, contact with someone diagnosed with COVID-19  .  and your symptoms.  . You should receive instructions from your physician's office regarding next steps of care.  . When you arrive at healthcare provider, tell the healthcare staff immediately you have returned from  . visiting [place], [place], [place], [place] or [place]; or traveled in the [place] to [place], [place],  . [place] or [place] in the last two weeks or you have been in close contact with a person diagnosed with  . COVID-19 in the last 2 weeks.   . Tell the health care staff about your symptoms: fever, cough and shortness of breath. . After you have been seen by a medical provider, you will be either: o Tested for (COVID-19) and  discharged home on quarantine except to seek medical care if  o symptoms worsen, and asked to  - Stay home and avoid contact with others until you get your results (4-5 days)  - Avoid travel on public transportation if possible (such as bus, train, or airplane) or o Sent to the Emergency Department by EMS for evaluation, COVID-19 testing  and  o possible admission depending on your condition and test results.  What to do if you are LOW RISK for COVID-19?  Reduce your risk of any infection by using the same precautions used for avoiding the common cold or flu:  Marland Kitchen Wash your hands often with soap and warm water for at least 20 seconds.  If soap and water are not readily available,  . use an alcohol-based hand sanitizer with at least 60% alcohol.  . If coughing or sneezing, cover your mouth and nose by coughing or sneezing into the elbow areas of your shirt or coat, .  into a tissue or into your sleeve (not your hands). . Avoid shaking hands with others and consider head nods or verbal greetings only. . Avoid touching your eyes, nose, or mouth with unwashed hands.  . Avoid close contact with people who are sick. . Avoid places or events with large numbers of people in one location, like concerts or sporting events. . Carefully consider travel plans  you have or are making. . If you are planning any travel outside or inside the [place], visit the CDC's Travelers' Health webpage for the latest health notices. . If you have some symptoms but not all symptoms, continue to monitor at home and seek medical attention  . if your symptoms worsen. . If you are having a medical emergency, call 911. >>>>>>>>>>>>>>>>>>>>>>>>>>>> Preventive Care for Adults  A healthy lifestyle and preventive care can promote health and wellness. Preventive health guidelines for men include the following key practices:  A routine yearly physical is a good way to check with your health care provider about your health and  preventative screening. It is a chance to share any concerns and updates on your health and to receive a thorough exam.  Visit your dentist for a routine exam and preventative care every 6 months. Brush your teeth twice a day and floss once a day. Good oral hygiene prevents tooth decay and gum disease.  The frequency of eye exams is based on your age, health, family medical history, use of contact lenses, and other factors. Follow your health care provider's recommendations for frequency of eye exams.  Eat a healthy diet. Foods such as vegetables, fruits, whole grains, low-fat dairy products, and lean protein foods contain the nutrients you need without too many calories. Decrease your intake of foods high in solid fats, added sugars, and salt. Eat the right amount of calories for you. Get information about a proper diet from your health care provider, if necessary.  Regular physical exercise is one of the most important things you can do for your health. Most adults should get at least 150 minutes of moderate-intensity exercise (any activity that increases your heart rate and causes you to sweat) each week. In addition, most adults need muscle-strengthening exercises on 2 or more days a week.  Maintain a healthy weight. The body mass index (BMI) is a screening tool to identify possible weight problems. It provides an estimate of body fat based on height and weight. Your health care provider can find your BMI and can help you achieve or maintain a healthy weight. For adults 20 years and older:  A BMI below 18.5 is considered underweight.  A BMI of 18.5 to 24.9 is normal.  A BMI of 25 to 29.9 is considered overweight.  A BMI of 30 and above is considered obese.  Maintain normal blood lipids and cholesterol levels by exercising and minimizing your intake of saturated fat. Eat a balanced diet with plenty of fruit and vegetables. Blood tests for lipids and cholesterol should begin at age 83 and be  repeated every 5 years. If your lipid or cholesterol levels are high, you are over 50, or you are at high risk for heart disease, you may need your cholesterol levels checked more frequently. Ongoing high lipid and cholesterol levels should be treated with medicines if diet and exercise are not working.  If you smoke, find out from your health care provider how to quit. If you do not use tobacco, do not start.  Lung cancer screening is recommended for adults aged 60-80 years who are at high risk for developing lung cancer because of a history of smoking. A yearly low-dose CT scan of the lungs is recommended for people who have at least a 30-pack-year history of smoking and are a current smoker or have quit within the past 15 years. A pack year of smoking is smoking an average of 1 pack of cigarettes a  day for 1 year (for example: 1 pack a day for 30 years or 2 packs a day for 15 years). Yearly screening should continue until the smoker has stopped smoking for at least 15 years. Yearly screening should be stopped for people who develop a health problem that would prevent them from having lung cancer treatment.  If you choose to drink alcohol, do not have more than 2 drinks per day. One drink is considered to be 12 ounces (355 mL) of beer, 5 ounces (148 mL) of wine, or 1.5 ounces (44 mL) of liquor.  Avoid use of street drugs. Do not share needles with anyone. Ask for help if you need support or instructions about stopping the use of drugs.  High blood pressure causes heart disease and increases the risk of stroke. Your blood pressure should be checked at least every 1-2 years. Ongoing high blood pressure should be treated with medicines, if weight loss and exercise are not effective.  If you are 32-25 years old, ask your health care provider if you should take aspirin to prevent heart disease.  Diabetes screening involves taking a blood sample to check your fasting blood sugar level. This should be done  once every 3 years, after age 62, if you are within normal weight and without risk factors for diabetes. Testing should be considered at a younger age or be carried out more frequently if you are overweight and have at least 1 risk factor for diabetes.  Colorectal cancer can be detected and often prevented. Most routine colorectal cancer screening begins at the age of 42 and continues through age 46. However, your health care provider may recommend screening at an earlier age if you have risk factors for colon cancer. On a yearly basis, your health care provider may provide home test kits to check for hidden blood in the stool. Use of a small camera at the end of a tube to directly examine the colon (sigmoidoscopy or colonoscopy) can detect the earliest forms of colorectal cancer. Talk to your health care provider about this at age 9, when routine screening begins. Direct exam of the colon should be repeated every 5-10 years through age 84, unless early forms of precancerous polyps or small growths are found.   Talk with your health care provider about prostate cancer screening.  Testicular cancer screening isrecommended for adult males. Screening includes self-exam, a health care provider exam, and other screening tests. Consult with your health care provider about any symptoms you have or any concerns you have about testicular cancer.  Use sunscreen. Apply sunscreen liberally and repeatedly throughout the day. You should seek shade when your shadow is shorter than you. Protect yourself by wearing long sleeves, pants, a wide-brimmed hat, and sunglasses year round, whenever you are outdoors.  Once a month, do a whole-body skin exam, using a mirror to look at the skin on your back. Tell your health care provider about new moles, moles that have irregular borders, moles that are larger than a pencil eraser, or moles that have changed in shape or color.  Stay current with required vaccines  (immunizations).  Influenza vaccine. All adults should be immunized every year.  Tetanus, diphtheria, and acellular pertussis (Td, Tdap) vaccine. An adult who has not previously received Tdap or who does not know his vaccine status should receive 1 dose of Tdap. This initial dose should be followed by tetanus and diphtheria toxoids (Td) booster doses every 10 years. Adults with an unknown or incomplete history  of completing a 3-dose immunization series with Td-containing vaccines should begin or complete a primary immunization series including a Tdap dose. Adults should receive a Td booster every 10 years.  Varicella vaccine. An adult without evidence of immunity to varicella should receive 2 doses or a second dose if he has previously received 1 dose.  Human papillomavirus (HPV) vaccine. Males aged 82-21 years who have not received the vaccine previously should receive the 3-dose series. Males aged 22-26 years may be immunized. Immunization is recommended through the age of 69 years for any male who has sex with males and did not get any or all doses earlier. Immunization is recommended for any person with an immunocompromised condition through the age of 15 years if he did not get any or all doses earlier. During the 3-dose series, the second dose should be obtained 4-8 weeks after the first dose. The third dose should be obtained 24 weeks after the first dose and 16 weeks after the second dose.  Zoster vaccine. One dose is recommended for adults aged 51 years or older unless certain conditions are present.    PREVNAR  - Pneumococcal 13-valent conjugate (PCV13) vaccine. When indicated, a person who is uncertain of his immunization history and has no record of immunization should receive the PCV13 vaccine. An adult aged 70 years or older who has certain medical conditions and has not been previously immunized should receive 1 dose of PCV13 vaccine. This PCV13 should be followed with a dose of  pneumococcal polysaccharide (PPSV23) vaccine. The PPSV23 vaccine dose should be obtained at least 1 r more year(s) after the dose of PCV13 vaccine. An adult aged 65 years or older who has certain medical conditions and previously received 1 or more doses of PPSV23 vaccine should receive 1 dose of PCV13. The PCV13 vaccine dose should be obtained 1 or more years after the last PPSV23 vaccine dose.    PNEUMOVAX - Pneumococcal polysaccharide (PPSV23) vaccine. When PCV13 is also indicated, PCV13 should be obtained first. All adults aged 35 years and older should be immunized. An adult younger than age 55 years who has certain medical conditions should be immunized. Any person who resides in a nursing home or long-term care facility should be immunized. An adult smoker should be immunized. People with an immunocompromised condition and certain other conditions should receive both PCV13 and PPSV23 vaccines. People with human immunodeficiency virus (HIV) infection should be immunized as soon as possible after diagnosis. Immunization during chemotherapy or radiation therapy should be avoided. Routine use of PPSV23 vaccine is not recommended for American Indians, [place] Natives, or people younger than 65 years unless there are medical conditions that require PPSV23 vaccine. When indicated, people who have unknown immunization and have no record of immunization should receive PPSV23 vaccine. One-time revaccination 5 years after the first dose of PPSV23 is recommended for people aged 19-64 years who have chronic kidney failure, nephrotic syndrome, asplenia, or immunocompromised conditions. People who received 1-2 doses of PPSV23 before age 8 years should receive another dose of PPSV23 vaccine at age 37 years or later if at least 5 years have passed since the previous dose. Doses of PPSV23 are not needed for people immunized with PPSV23 at or after age 46 years.    Hepatitis A vaccine. Adults who wish to be protected  from this disease, have certain high-risk conditions, work with hepatitis A-infected animals, work in hepatitis A research labs, or travel to or work in countries with a high rate of  hepatitis A should be immunized. Adults who were previously unvaccinated and who anticipate close contact with an international adoptee during the first 60 days after arrival in the [place] from a country with a high rate of hepatitis A should be immunized.    Hepatitis B vaccine. Adults should be immunized if they wish to be protected from this disease, have certain high-risk conditions, may be exposed to blood or other infectious body fluids, are household contacts or sex partners of hepatitis B positive people, are clients or workers in certain care facilities, or travel to or work in countries with a high rate of hepatitis B.   Preventive Service / Frequency   Ages 75 to 72  Blood pressure check.  Lipid and cholesterol check  Lung cancer screening. / Every year if you are aged 25-80 years and have a 30-pack-year history of smoking and currently smoke or have quit within the past 15 years. Yearly screening is stopped once you have quit smoking for at least 15 years or develop a health problem that would prevent you from having lung cancer treatment.  Fecal occult blood test (FOBT) of stool. / Every year beginning at age 6 and continuing until age 26. You may not have to do this test if you get a colonoscopy every 10 years.  Flexible sigmoidoscopy** or colonoscopy.** / Every 5 years for a flexible sigmoidoscopy or every 10 years for a colonoscopy beginning at age 21 and continuing until age 21. Screening for abdominal aortic aneurysm (AAA)  by ultrasound is recommended for people who have history of high blood pressure or who are current or former smokers. +++++++++++ Recommend Adult Low Dose Aspirin or  coated  Aspirin 81 mg daily  To reduce risk of Colon Cancer 40 %,  Skin Cancer 26 % ,  Malignant  Melanoma 46%  and  Pancreatic cancer 60% ++++++++++++++++++++ Vitamin D goal  is between 70-100.  Please make sure that you are taking your Vitamin D as directed.  It is very important as a natural anti-inflammatory  helping hair, skin, and nails, as well as reducing stroke and heart attack risk.  It helps your bones and helps with mood. It also decreases numerous cancer risks so please take it as directed.  Low Vit D is associated with a 200-300% higher risk for CANCER  and 200-300% higher risk for HEART   ATTACK  &  STROKE.   .....................................Marland Kitchen It is also associated with higher death rate at younger ages,  autoimmune diseases like Rheumatoid arthritis, Lupus, Multiple Sclerosis.    Also many other serious conditions, like depression, Alzheimer's Dementia, infertility, muscle aches, fatigue, fibromyalgia - just to name a few. +++++++++++++++++++++ Recommend the book "The END of DIETING" by Dr Excell Seltzer  & the book "The END of DIABETES " by Dr Excell Seltzer At [email or web address] - get book & Audio CD's    Being diabetic has a  300% increased risk for heart attack, stroke, cancer, and alzheimer- type vascular dementia. It is very important that you work harder with diet by avoiding all foods that are white. Avoid white rice (brown & wild rice is OK), white potatoes (sweetpotatoes in moderation is OK), White bread or wheat bread or anything made out of white flour like bagels, donuts, rolls, buns, biscuits, cakes, pastries, cookies, pizza crust, and pasta (made from white flour & egg whites) - vegetarian pasta or spinach or wheat pasta is OK. Multigrain breads like Arnold's or [employer], or multigrain sandwich thins  or flatbreads.  Diet, exercise and weight loss can reverse and cure diabetes in the early stages.  Diet, exercise and weight loss is very important in the control and prevention of complications of diabetes which affects every system in your body, ie. Brain -  dementia/stroke, eyes - glaucoma/blindness, heart - heart attack/heart failure, kidneys - dialysis, stomach - gastric paralysis, intestines - malabsorption, nerves - severe painful neuritis, circulation - gangrene & loss of a leg(s), and finally cancer and Alzheimers.    I recommend avoid fried & greasy foods,  sweets/candy, white rice (brown or wild rice or Quinoa is OK), white potatoes (sweet potatoes are OK) - anything made from white flour - bagels, doughnuts, rolls, buns, biscuits,white and wheat breads, pizza crust and traditional pasta made of white flour & egg white(vegetarian pasta or spinach or wheat pasta is OK).  Multi-grain bread is OK - like multi-grain flat bread or sandwich thins. Avoid alcohol in excess. Exercise is also important.    Eat all the vegetables you want - avoid meat, especially red meat and dairy - especially cheese.  Cheese is the most concentrated form of trans-fats which is the worst thing to clog up our arteries. Veggie cheese is OK which can be found in the fresh produce section at [employer] or [employer] or [employer]  ++++++++++++++++++++++ DASH Eating Plan  DASH stands for "Dietary Approaches to Stop Hypertension."   The DASH eating plan is a healthy eating plan that has been shown to reduce high blood pressure (hypertension). Additional health benefits may include reducing the risk of type 2 diabetes mellitus, heart disease, and stroke. The DASH eating plan may also help with weight loss. WHAT DO I NEED TO KNOW ABOUT THE DASH EATING PLAN? For the DASH eating plan, you will follow these general guidelines:  Choose foods with a percent daily value for sodium of less than 5% (as listed on the food label).  Use salt-free seasonings or herbs instead of table salt or sea salt.  Check with your health care provider or pharmacist before using salt substitutes.  Eat lower-sodium products, often labeled as "lower sodium" or "no salt added."  Eat fresh  foods.  Eat more vegetables, fruits, and low-fat dairy products.  Choose whole grains. Look for the word "whole" as the first word in the ingredient list.  Choose fish   Limit sweets, desserts, sugars, and sugary drinks.  Choose heart-healthy fats.  Eat veggie cheese   Eat more home-cooked food and less restaurant, buffet, and fast food.  Limit fried foods.  Cook foods using methods other than frying.  Limit canned vegetables. If you do use them, rinse them well to decrease the sodium.  When eating at a restaurant, ask that your food be prepared with less salt, or no salt if possible.                      WHAT FOODS CAN I EAT? Read Dr Fara Olden Fuhrman's books on The End of Dieting & The End of Diabetes  Grains Whole grain or whole wheat bread. Brown rice. Whole grain or whole wheat pasta. Quinoa, bulgur, and whole grain cereals. Low-sodium cereals. Corn or whole wheat flour tortillas. Whole grain cornbread. Whole grain crackers. Low-sodium crackers.  Vegetables Fresh or frozen vegetables (raw, steamed, roasted, or grilled). Low-sodium or reduced-sodium tomato and vegetable juices. Low-sodium or reduced-sodium tomato sauce and paste. Low-sodium or reduced-sodium canned vegetables.   Fruits All fresh, canned (in natural juice), or  frozen fruits.  Protein Products  All fish and seafood.  Dried beans, peas, or lentils. Unsalted nuts and seeds. Unsalted canned beans.  Dairy Low-fat dairy products, such as skim or 1% milk, 2% or reduced-fat cheeses, low-fat ricotta or cottage cheese, or plain low-fat yogurt. Low-sodium or reduced-sodium cheeses.  Fats and Oils Tub margarines without trans fats. Light or reduced-fat mayonnaise and salad dressings (reduced sodium). Avocado. Safflower, olive, or canola oils. Natural peanut or almond butter.  Other Unsalted popcorn and pretzels. The items listed above may not be a complete list of recommended foods or beverages. Contact your  dietitian for more options.  +++++++++++++++++++  WHAT FOODS ARE NOT RECOMMENDED? Grains/ White flour or wheat flour White bread. White pasta. White rice. Refined cornbread. Bagels and croissants. Crackers that contain trans fat.  Vegetables  Creamed or fried vegetables. Vegetables in a . Regular canned vegetables. Regular canned tomato sauce and paste. Regular tomato and vegetable juices.  Fruits Dried fruits. Canned fruit in light or heavy syrup. Fruit juice.  Meat and Other Protein Products Meat in general - RED meat & White meat.  Fatty cuts of meat. Ribs, chicken wings, all processed meats as bacon, sausage, bologna, salami, fatback, hot dogs, bratwurst and packaged luncheon meats.  Dairy Whole or 2% milk, cream, half-and-half, and cream cheese. Whole-fat or sweetened yogurt. Full-fat cheeses or blue cheese. Non-dairy creamers and whipped toppings. Processed cheese, cheese spreads, or cheese curds.  Condiments Onion and garlic salt, seasoned salt, table salt, and sea salt. Canned and packaged gravies. Worcestershire sauce. Tartar sauce. Barbecue sauce. Teriyaki sauce. Soy sauce, including reduced sodium. Steak sauce. Fish sauce. Oyster sauce. Cocktail sauce. Horseradish. Ketchup and mustard. Meat flavorings and tenderizers. Bouillon cubes. Hot sauce. Tabasco sauce. Marinades. Taco seasonings. Relishes.  Fats and Oils Butter, stick margarine, lard, shortening and bacon fat. Coconut, palm kernel, or palm oils. Regular salad dressings.  Pickles and olives. Salted popcorn and pretzels.  The items listed above may not be a complete list of foods and beverages to avoid.

## 2018-11-12 NOTE — Progress Notes (Signed)
[facility]  Unk Pinto, M.D.        Uvaldo Bristle. Silverio Lay, P.A.-C         Liane Comber, [place]                [address] [place], [place] [address] Telephone [phone] Telefax [phone] Annual  Screening/Preventative Visit  & Comprehensive Evaluation & Examination     This very nice 43 y.o. MBM presents for a Screening /Preventative Visit & comprehensive evaluation and management of multiple medical co-morbidities.  Patient has been followed expectantly  for labile elevated BP,  elevated Lipids, abnormal glucose, Vitamin D Deficiency. Also, he's concerned re: a thickened dystrophic Lt 1st toenail.       Patient has remote hx/o elevated BP and has been followed expectantly.  Patient's BP has been controlled and today's BP is at goal - 100/64. Patient denies any cardiac symptoms as chest pain, palpitations, shortness of breath, dizziness or ankle swelling.     Patient's hyperlipidemia is not controlled with diet. Last lipids were not at goal:  Lab Results  Component Value Date   CHOL 172 10/17/2017   HDL 36 (L) 10/17/2017   LDLCALC 115 (H) 10/17/2017   TRIG 106 10/17/2017   CHOLHDL 4.8 10/17/2017      Patient has hx/o prediabetes  (A1c 6.1% / 2011 & then 5.7% / 2013)  and patient denies reactive hypoglycemic symptoms, visual blurring, diabetic polys or paresthesias. Last A1c was Normal & at goal: Lab Results  Component Value Date   HGBA1C 5.5 10/17/2017       Patient has hx/o Low Testosterone  ( "222" / 2016 and "306" / 2015) and has declined therapy in the past. Taking Zinc supplements, in May 2019 , his Testosterone was back up to Normal ("428" / May 2019) .     Finally, patient has history of Vitamin D Deficiency  ("26" / 2010) and last vitamin D was at goal: Lab Results  Component Value Date   VD25OH 77 10/17/2017   Current Outpatient Medications on File Prior to Visit   Medication Sig  . aspirin EC 81 MG tablet Take 81 mg by mouth daily.  Marland Kitchen b complex vitamins capsule Take 1 capsule by mouth daily.  . Cholecalciferol (VITAMIN D PO) Take 10,000 Int'l Units by mouth daily.   . Cinnamon 500 MG capsule Take 500 mg by mouth daily.  . Zinc 50 MG CAPS Take 1 capsule by mouth daily.   No current facility-administered medications on file prior to visit.    No Known Allergies   Past Medical History:  Diagnosis Date  . Prediabetes   . Vitamin D deficiency    Health Maintenance  Topic Date Due  . INFLUENZA VACCINE  01/10/2019  . TETANUS/TDAP  07/13/2025  . HIV Screening  Completed   Immunization History  Administered Date(s) Administered  . Influenza Inj Mdck Quad With Preservative 05/01/2017, 04/25/2018  . PPD Test 08/28/2013, 07/06/2014, 09/25/2016, 10/18/2017  . Pneumococcal Polysaccharide-23 04/28/2008  . Td 06/11/2005  . Tdap 07/14/2015   Past Surgical History:  Procedure Laterality Date  . NO PAST SURGERIES     Family History  Problem Relation Age of Onset  . Diabetes Mother   . Cancer Mother        Breast  . Hypertension Father   . Hyperlipidemia Father   .  Cancer Father        Prostate  . Cancer Maternal Grandmother        lung  . Cancer Maternal Grandfather        lung   Social History   Socioeconomic History  . Marital status: Married    Spouse name: Lauren  . Number of children: 1 son 29 yo and 1 daughter 84 yo  Occupational History  . Real [employer]  Tobacco Use  . Smoking status: Never Smoker  . Smokeless tobacco: Never Used  Substance and Sexual Activity  . Alcohol use: Yes    Comment: ocassional  . Drug use: No  . Sexual activity: Not on file    ROS Constitutional: Denies fever, chills, weight loss/gain, headaches, insomnia,  night sweats or change in appetite. Does c/o fatigue. Eyes: Denies redness, blurred vision, diplopia, discharge, itchy or watery eyes.  ENT: Denies discharge, congestion, post nasal drip,  epistaxis, sore throat, earache, hearing loss, dental pain, Tinnitus, Vertigo, Sinus pain or snoring.  Cardio: Denies chest pain, palpitations, irregular heartbeat, syncope, dyspnea, diaphoresis, orthopnea, PND, claudication or edema Respiratory: denies cough, dyspnea, DOE, pleurisy, hoarseness, laryngitis or wheezing.  Gastrointestinal: Denies dysphagia, heartburn, reflux, water brash, pain, cramps, nausea, vomiting, bloating, diarrhea, constipation, hematemesis, melena, hematochezia, jaundice or hemorrhoids Genitourinary: Denies dysuria, frequency, urgency, nocturia, hesitancy, discharge, hematuria or flank pain Musculoskeletal: Denies arthralgia, myalgia, stiffness, Jt. Swelling, pain, limp or strain/sprain. Denies Falls. Skin: Denies puritis, rash, hives, warts, acne, eczema or change in skin lesion Neuro: No weakness, tremor, incoordination, spasms, paresthesia or pain Psychiatric: Denies confusion, memory loss or sensory loss. Denies Depression. Endocrine: Denies change in weight, skin, hair change, nocturia, and paresthesia, diabetic polys, visual blurring or hyper / hypo glycemic episodes.  Heme/Lymph: No excessive bleeding, bruising or enlarged lymph nodes.  Physical Exam  BP 100/64   Pulse 72   Temp (!) 97 F (36.1 C)   Resp 16   Ht 5' 9"  (1.753 m)   Wt 171 lb 9.6 oz (77.8 kg)   BMI 25.34 kg/m   General Appearance: Well nourished and well groomed and in no apparent distress.  Eyes: PERRLA, EOMs, conjunctiva no swelling or erythema, normal fundi and vessels. Sinuses: No frontal/maxillary tenderness ENT/Mouth: EACs patent / TMs  nl. Nares clear without erythema, swelling, mucoid exudates. Oral hygiene is good. No erythema, swelling, or exudate. Tongue normal, non-obstructing. Tonsils not swollen or erythematous. Hearing normal.  Neck: Supple, thyroid not palpable. No bruits, nodes or JVD. Respiratory: Respiratory effort normal.  BS equal and clear bilateral without rales,  rhonci, wheezing or stridor. Cardio: Heart sounds are normal with regular rate and rhythm and no murmurs, rubs or gallops. Peripheral pulses are normal and equal bilaterally without edema. No aortic or femoral bruits. Chest: symmetric with normal excursions and percussion.  Abdomen: Soft, with Nl bowel sounds. Nontender, no guarding, rebound, hernias, masses, or organomegaly.  Lymphatics: Non tender without lymphadenopathy.  Musculoskeletal: Full ROM all peripheral extremities, joint stability, 5/5 strength, and normal gait. Skin: Warm and dry without rashes, lesions, cyanosis, clubbing or  ecchymosis.  Lt 1st toenail thickened , chalky and dystrophic.  Neuro: Cranial nerves intact, reflexes equal bilaterally. Normal muscle tone, no cerebellar symptoms. Sensation intact.  Pysch: Alert and oriented X 3 with normal affect, insight and judgment appropriate.   Assessment and Plan  1. Annual Preventative/Screening Exam   2. Labile hypertension  - EKG 12-Lead - Urinalysis, Routine w reflex microscopic - Microalbumin / creatinine urine ratio -  CBC with Differential/Platelet - COMPLETE METABOLIC PANEL WITH GFR - Magnesium - TSH  3. Hyperlipidemia, mixed  - EKG 12-Lead - Lipid panel - TSH  4. Abnormal glucose  - EKG 12-Lead - Hemoglobin A1c - Insulin, random  5. Vitamin D deficiency  - VITAMIN D 25 Hydroxyl  6. Testosterone deficiency  - Testosterone  7. Screening for colorectal cancer  - POC Hemoccult Bld/Stl   8. Prostate cancer screening  - PSA  9. Screening examination for pulmonary tuberculosis  - TB Skin Test  10. Screening for ischemic heart disease  - EKG 12-Lead  11. FH: hypertension  - EKG 12-Lead  12. Fatigue, unspecified type  - Iron,Total/Total Iron Binding Cap - Vitamin B12 - Testosterone  13. Onychomycosis Lt Great Toe  - Lamisil 250 mg #90  - Re-check Liver profile 6 weks  14. Medication management  - Urinalysis, Routine w reflex  microscopic - Microalbumin / creatinine urine ratio - CBC with Differential/Platelet - COMPLETE METABOLIC PANEL WITH GFR - Magnesium - Lipid panel - TSH - Hemoglobin A1c - Insulin, random - VITAMIN D 25 Hydroxyl        Patient was counseled in prudent diet, weight control to achieve/maintain BMI less than 25, BP monitoring, regular exercise and medications as discussed.  Discussed med effects and SE's. Routine screening labs and tests as requested with regular follow-up as recommended. Over 40 minutes of exam, counseling, chart review and high complex critical decision making was performed

## 2018-11-13 LAB — COMPLETE METABOLIC PANEL WITH GFR
AG Ratio: 2 (calc) (ref 1.0–2.5)
ALT: 19 U/L (ref 9–46)
AST: 18 U/L (ref 10–40)
Albumin: 4.8 g/dL (ref 3.6–5.1)
Alkaline phosphatase (APISO): 52 U/L (ref 36–130)
BUN: 13 mg/dL (ref 7–25)
CO2: 26 mmol/L (ref 20–32)
Calcium: 9.9 mg/dL (ref 8.6–10.3)
Chloride: 104 mmol/L (ref 98–110)
Creat: 1 mg/dL (ref 0.60–1.35)
GFR, Est African American: 108 mL/min/{1.73_m2} (ref >=60)
GFR, Est Non African American: 93 mL/min/{1.73_m2} (ref >=60)
Globulin: 2.4 g/dL (calc) (ref 1.9–3.7)
Glucose, Bld: 126 mg/dL — ABNORMAL HIGH (ref 65–99)
Potassium: 4.1 mmol/L (ref 3.5–5.3)
Sodium: 140 mmol/L (ref 135–146)
Total Bilirubin: 0.5 mg/dL (ref 0.2–1.2)
Total Protein: 7.2 g/dL (ref 6.1–8.1)

## 2018-11-13 LAB — CBC WITH DIFFERENTIAL/PLATELET
Absolute Monocytes: 560 cells/uL (ref 200–950)
Basophils Absolute: 80 cells/uL (ref 0–200)
Basophils Relative: 1 %
Eosinophils Absolute: 480 cells/uL (ref 15–500)
Eosinophils Relative: 6 %
HCT: 45.1 % (ref 38.5–50.0)
Hemoglobin: 15.4 g/dL (ref 13.2–17.1)
Lymphs Abs: 2640 cells/uL (ref 850–3900)
MCH: 29.4 pg (ref 27.0–33.0)
MCHC: 34.1 g/dL (ref 32.0–36.0)
MCV: 86.2 fL (ref 80.0–100.0)
MPV: 9.8 fL (ref 7.5–12.5)
Monocytes Relative: 7 %
Neutro Abs: 4240 cells/uL (ref 1500–7800)
Neutrophils Relative %: 53 %
Platelets: 295 10*3/uL (ref 140–400)
RBC: 5.23 10*6/uL (ref 4.20–5.80)
RDW: 12.6 % (ref 11.0–15.0)
Total Lymphocyte: 33 %
WBC: 8 10*3/uL (ref 3.8–10.8)

## 2018-11-13 LAB — URINALYSIS, ROUTINE W REFLEX MICROSCOPIC
Bilirubin Urine: NEGATIVE
Glucose, UA: NEGATIVE
Hgb urine dipstick: NEGATIVE
Ketones, ur: NEGATIVE
Leukocytes,Ua: NEGATIVE
Nitrite: NEGATIVE
Protein, ur: NEGATIVE
Specific Gravity, Urine: 1.019 (ref 1.001–1.03)
pH: 5.5 (ref 5.0–8.0)

## 2018-11-13 LAB — HEMOGLOBIN A1C
Hgb A1c MFr Bld: 5.5 % of total Hgb (ref <5.7)
Mean Plasma Glucose: 111 (calc)
eAG (mmol/L): 6.2 (calc)

## 2018-11-13 LAB — TESTOSTERONE: Testosterone: 326 ng/dL (ref 250–827)

## 2018-11-13 LAB — LIPID PANEL
Cholesterol: 164 mg/dL (ref <200)
HDL: 42 mg/dL (ref >=40)
LDL Cholesterol (Calc): 96 mg/dL (calc)
Non-HDL Cholesterol (Calc): 122 mg/dL (calc) (ref <130)
Total CHOL/HDL Ratio: 3.9 (calc) (ref <5.0)
Triglycerides: 163 mg/dL — ABNORMAL HIGH (ref <150)

## 2018-11-13 LAB — INSULIN, RANDOM: Insulin: 23.7 u[IU]/mL — ABNORMAL HIGH

## 2018-11-13 LAB — MICROALBUMIN / CREATININE URINE RATIO
Creatinine, Urine: 110 mg/dL (ref 20–320)
Microalb Creat Ratio: 2 mcg/mg creat (ref <30)
Microalb, Ur: 0.2 mg/dL

## 2018-11-13 LAB — TSH: TSH: 1.12 mIU/L (ref 0.40–4.50)

## 2018-11-13 LAB — VITAMIN B12: Vitamin B-12: 320 pg/mL (ref 200–1100)

## 2018-11-13 LAB — VITAMIN D 25 HYDROXY (VIT D DEFICIENCY, FRACTURES): Vit D, 25-Hydroxy: 67 ng/mL (ref 30–100)

## 2018-11-13 LAB — IRON, TOTAL/TOTAL IRON BINDING CAP
Iron: 85 ug/dL (ref 50–180)
TIBC: 319 mcg/dL (calc) (ref 250–425)

## 2018-11-13 LAB — IRON,?TOTAL/TOTAL IRON BINDING CAP: %SAT: 27 % (calc) (ref 20–48)

## 2018-11-13 LAB — MAGNESIUM: Magnesium: 2 mg/dL (ref 1.5–2.5)

## 2018-11-13 LAB — PSA: PSA: 0.3 ng/mL (ref <=4.0)

## 2018-12-25 ENCOUNTER — Other Ambulatory Visit: Payer: Managed Care, Other (non HMO)

## 2019-01-01 ENCOUNTER — Other Ambulatory Visit: Payer: Self-pay

## 2019-01-01 ENCOUNTER — Other Ambulatory Visit: Payer: 59

## 2019-01-01 DIAGNOSIS — E782 Mixed hyperlipidemia: Secondary | ICD-10-CM | POA: Diagnosis not present

## 2019-01-01 DIAGNOSIS — B351 Tinea unguium: Secondary | ICD-10-CM

## 2019-01-01 DIAGNOSIS — R5383 Other fatigue: Secondary | ICD-10-CM | POA: Diagnosis not present

## 2019-01-01 DIAGNOSIS — Z79899 Other long term (current) drug therapy: Secondary | ICD-10-CM

## 2019-01-01 DIAGNOSIS — E538 Deficiency of other specified B group vitamins: Secondary | ICD-10-CM

## 2019-01-02 LAB — HEPATIC FUNCTION PANEL
AG Ratio: 1.9 (calc) (ref 1.0–2.5)
ALT: 17 U/L (ref 9–46)
AST: 19 U/L (ref 10–40)
Albumin: 4.6 g/dL (ref 3.6–5.1)
Alkaline phosphatase (APISO): 45 U/L (ref 36–130)
Bilirubin, Direct: 0.1 mg/dL (ref 0.0–0.2)
Globulin: 2.4 g/dL (calc) (ref 1.9–3.7)
Indirect Bilirubin: 0.4 mg/dL (calc) (ref 0.2–1.2)
Total Bilirubin: 0.5 mg/dL (ref 0.2–1.2)
Total Protein: 7 g/dL (ref 6.1–8.1)

## 2019-01-02 LAB — VITAMIN B12: Vitamin B-12: 365 pg/mL (ref 200–1100)

## 2019-01-12 IMAGING — CT CT RENAL STONE PROTOCOL
3 of 4 series · 9 of 46 positions shown, 16 images · non-contrast
Comparison: None.

CLINICAL DATA: Left-sided flank pain

EXAM:
CT ABDOMEN AND PELVIS WITHOUT CONTRAST
TECHNIQUE: Multidetector CT imaging of the abdomen and pelvis was performed
following the standard protocol without IV contrast.

[Series 4: lung bases · axial · 0.73mm/px · z∈[-730,-650]mm · 5 of 24 slices shown, 10 images]
[im 4/24  soft-tissue]
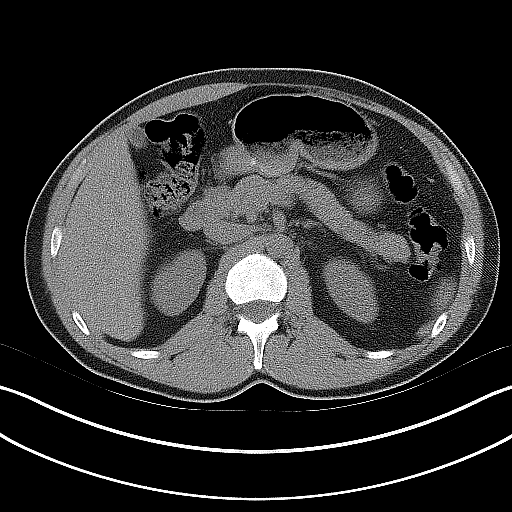
[im 4/24  bone]
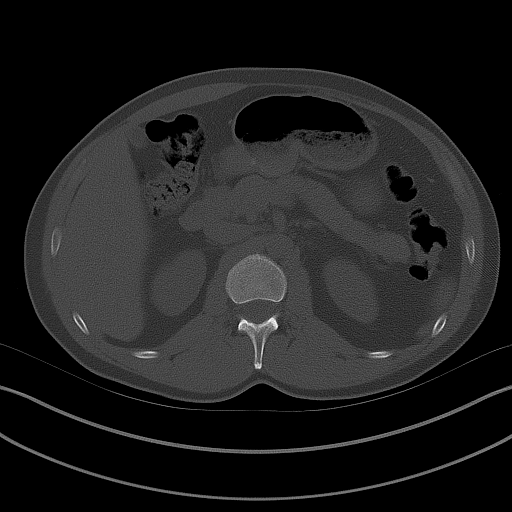
[im 8/24  soft-tissue]
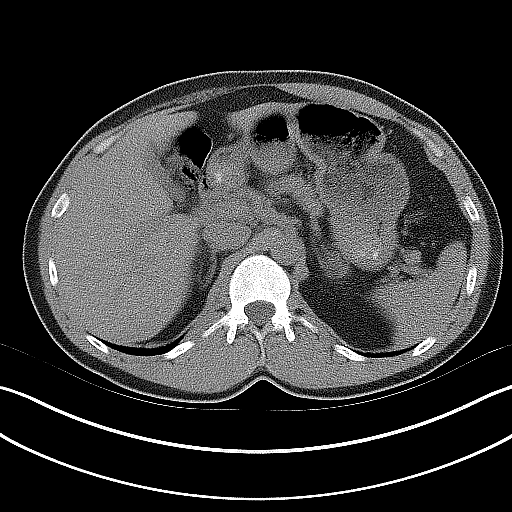
[im 8/24  lung]
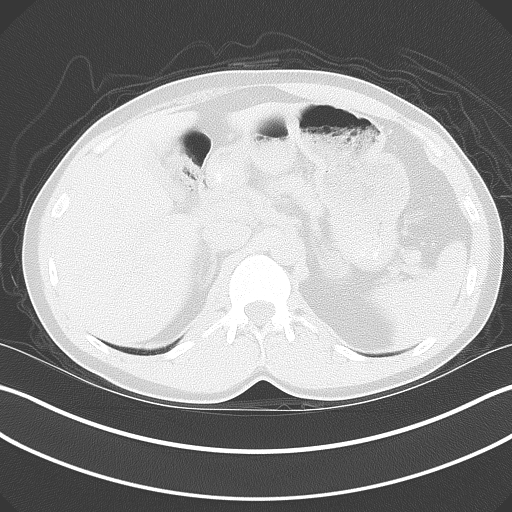
[im 12/24  soft-tissue]
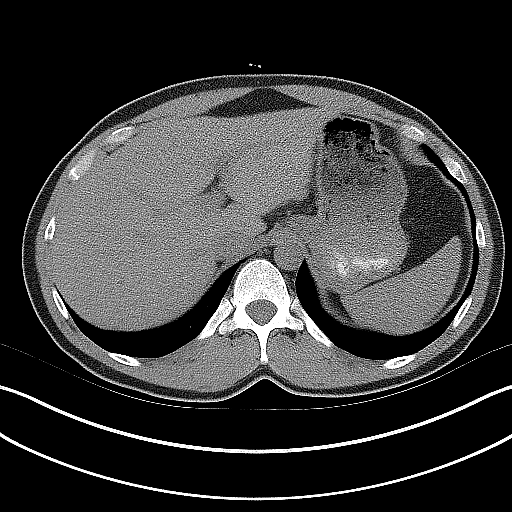
[im 12/24  lung]
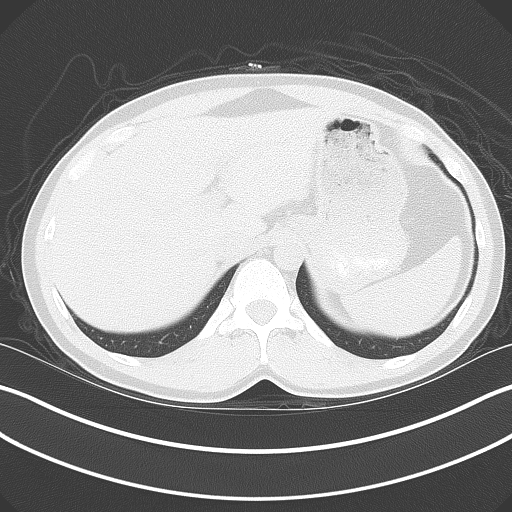
[im 16/24  soft-tissue]
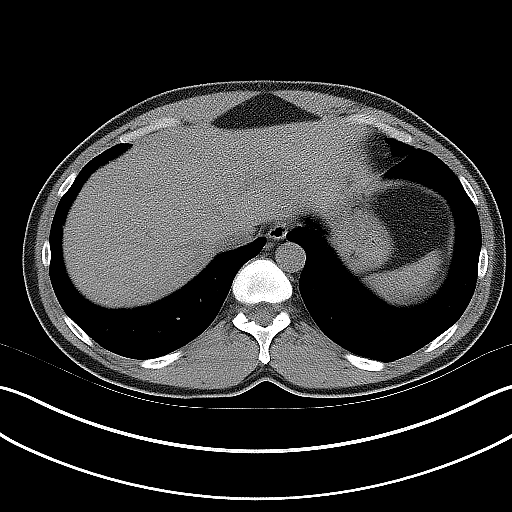
[im 16/24  lung]
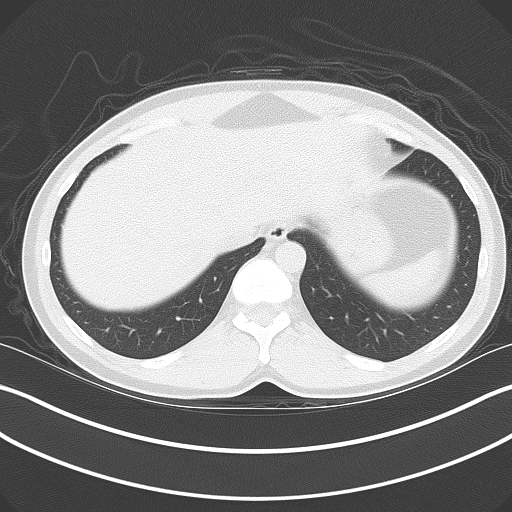
[im 20/24  soft-tissue]
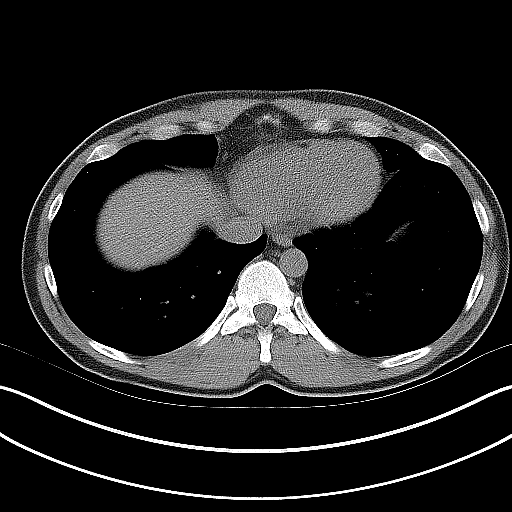
[im 20/24  lung]
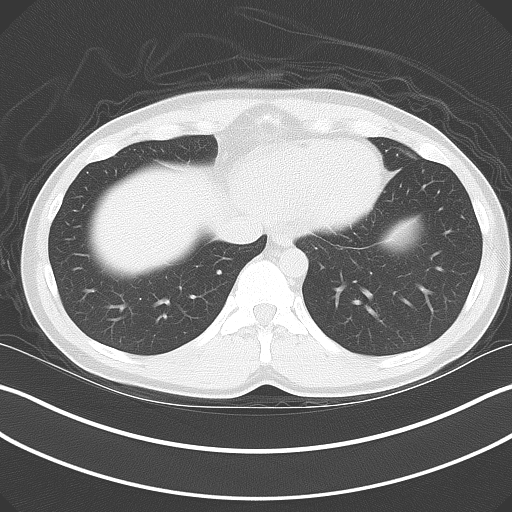

[Series 5: coronal · coronal · 0.74mm/px · 3 of 124 slices shown, 4 images]
[im 42/124  soft-tissue]
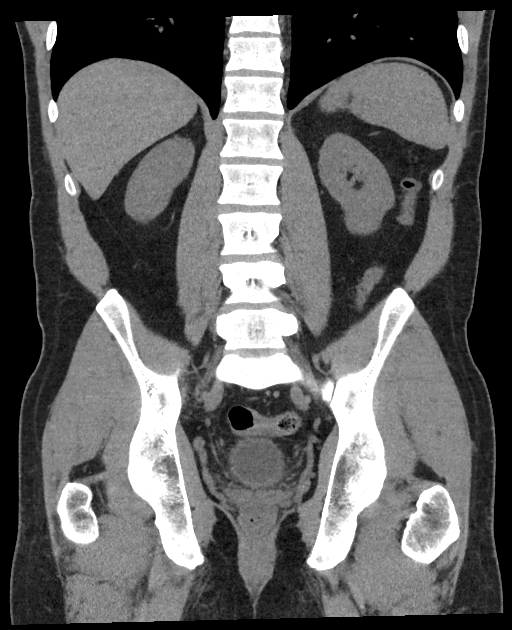
[im 55/124  soft-tissue]
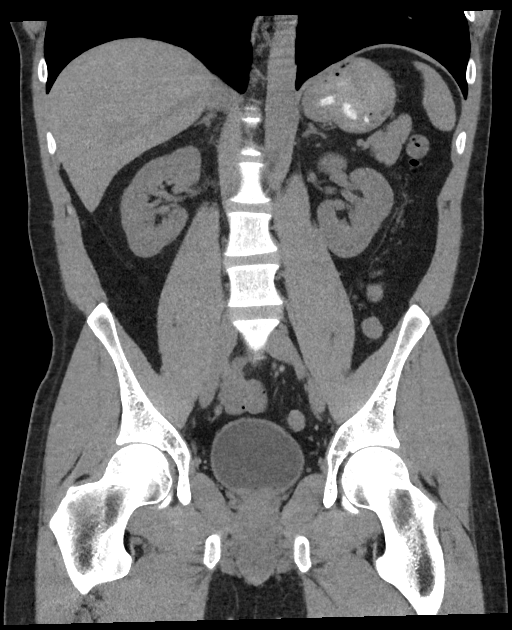
[im 55/124  bone]
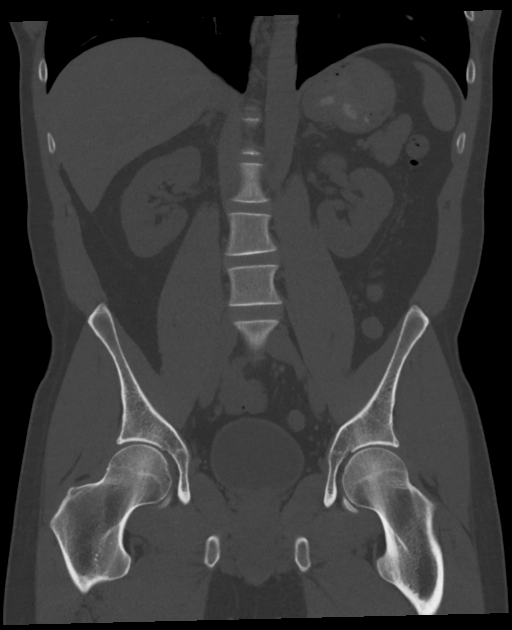
[im 69/124  soft-tissue]
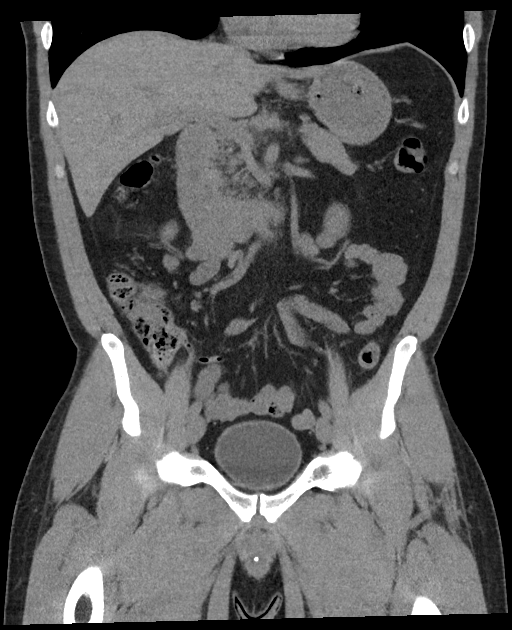

[Series 6: sagittal · sagittal · 0.59mm/px · 1 of 167 slices shown, 2 images]
[im 56/167  soft-tissue]
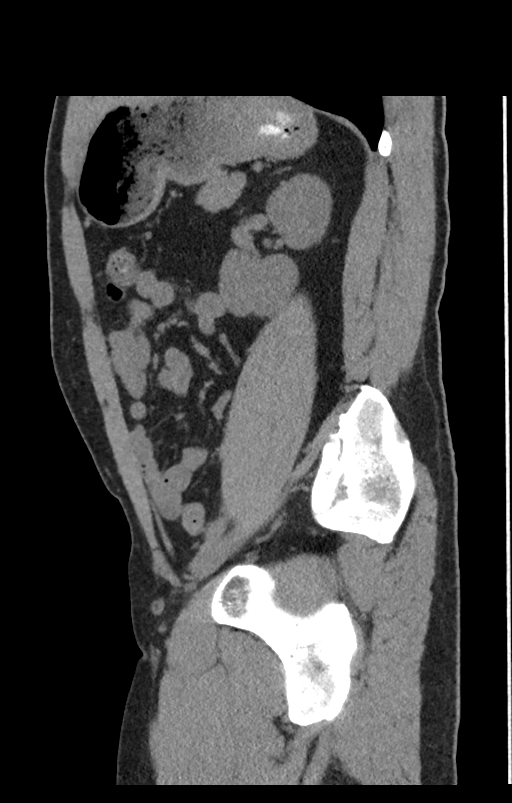
[im 56/167  bone]
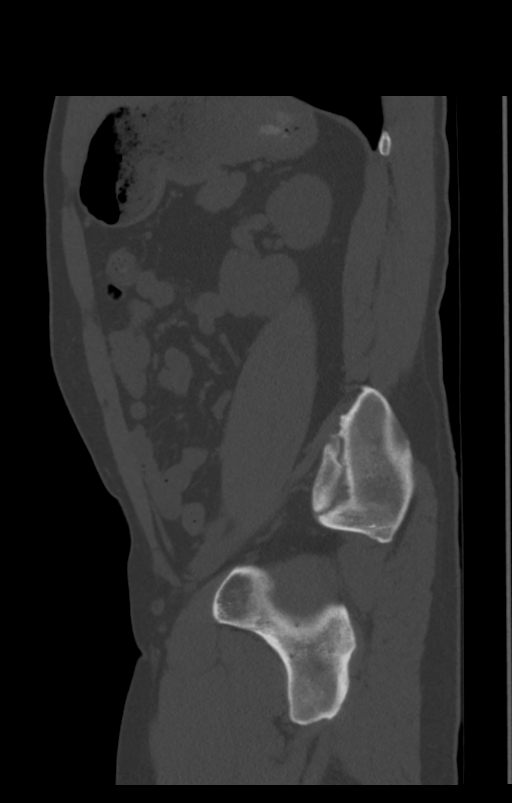

[9 of 46 positions shown; findings below may reference images not displayed]

FINDINGS: Lower chest: No acute abnormality.

Hepatobiliary: No focal liver abnormality is seen. No gallstones,
gallbladder wall thickening, or biliary dilatation.

Pancreas: Unremarkable. No pancreatic ductal dilatation or
surrounding inflammatory changes.

Spleen: Normal in size without focal abnormality.

Adrenals/Urinary Tract: Adrenal glands are within normal limits. No
hydronephrosis. Punctate nonobstructing stone in the mid left
kidney. The bladder is unremarkable.

Stomach/Bowel: Stomach is within normal limits. Appendix appears
normal. No evidence of bowel wall thickening, distention, or
inflammatory changes.

Vascular/Lymphatic: No significant vascular findings are present. No
enlarged abdominal or pelvic lymph nodes.

Reproductive: Prostate is unremarkable.

Other: No abdominal wall hernia or abnormality. No abdominopelvic
ascites.

Musculoskeletal: No acute or significant osseous findings.
IMPRESSION: Negative for hydronephrosis or ureteral stone. Punctate
nonobstructing stone in the left kidney. No acute abnormality is
visualized.

## 2019-01-22 ENCOUNTER — Other Ambulatory Visit: Payer: Self-pay

## 2019-01-22 DIAGNOSIS — Z20822 Contact with and (suspected) exposure to covid-19: Secondary | ICD-10-CM

## 2019-01-24 LAB — NOVEL CORONAVIRUS, NAA: SARS-CoV-2, NAA: NOT DETECTED

## 2019-01-24 LAB — SPECIMEN STATUS REPORT

## 2019-02-09 ENCOUNTER — Other Ambulatory Visit: Payer: Self-pay

## 2019-02-09 DIAGNOSIS — Z20822 Contact with and (suspected) exposure to covid-19: Secondary | ICD-10-CM

## 2019-02-11 LAB — NOVEL CORONAVIRUS, NAA: SARS-CoV-2, NAA: NOT DETECTED

## 2019-03-25 ENCOUNTER — Other Ambulatory Visit: Payer: Self-pay

## 2019-03-25 DIAGNOSIS — Z20822 Contact with and (suspected) exposure to covid-19: Secondary | ICD-10-CM

## 2019-03-26 LAB — NOVEL CORONAVIRUS, NAA: SARS-CoV-2, NAA: DETECTED — AB

## 2019-04-20 NOTE — Progress Notes (Signed)
   History of Present Illness:    Patient is a very nice 43 yo MBM relating testing (+) for Covid-19 on Oct 15 ~ 3 weeks ago as he experienced vague sx's of fatigue, abdominal cramping and diarrhea. Denied respiratory sx's, congestion, dyspnea, fever, chills, alterations of taste / smell. He self quarantined . Neither his wife nor 2 children became ill or tested (+) for Covid.   Medication Sig  . Cholecalciferol (VITAMIN D PO) Take 10,000 Int'l Units by mouth daily.   . Cinnamon 500 MG capsule Take 500 mg by mouth daily.  . Cyanocobalamin (VITAMIN B 12 PO) Take by mouth. Takes 1 tablet SL daily.  . Zinc 50 MG CAPS Take 1 capsule by mouth daily.  Marland Kitchen aspirin EC 81 MG tablet Take 81 mg by mouth daily.  Marland Kitchen b complex vitamins capsule Take 1 capsule by mouth daily.  Marland Kitchen terbinafine (LAMISIL) 250 MG tablet Take 1 tablet Daily for Nail Fungus   No Known Allergies   Past Medical History:  Diagnosis Date  . Prediabetes   . Vitamin D deficiency    Medications  .  aspirin EC 81 MG tablet, Take 81 mg by mouth daily. Marland Kitchen  b complex vitamins capsule, Take 1 capsule by mouth daily. .  Cholecalciferol (VITAMIN D PO), Take 10,000 Int'l Units by mouth daily.  .  Cinnamon 500 MG capsule, Take 500 mg by mouth daily. Marland Kitchen  terbinafine (LAMISIL) 250 MG tablet, Take 1 tablet Daily for Nail Fungus .  Zinc 50 MG CAPS, Take 1 capsule by mouth daily.  Problem list He has Vitamin D deficiency; Prediabetes; Testosterone deficiency; Hyperlipidemia (LDL Chol); Labile hypertension; and Medication management on their problem list.   Observations/Objective:  BP 108/60   Pulse 72   Temp 97.9 F (36.6 C)   Resp 16   Ht 5' 9"  (1.753 m)   Wt 171 lb 3.2 oz (77.7 kg)   BMI 25.28 kg/m   HEENT - WNL. Neck - supple.  Chest - Clear equal BS. Cor - Nl HS. RRR w/o sig MGR. PP 1(+). No edema. MS- FROM w/o deformities.  Gait Nl. Neuro -  Nl w/o focal abnormalities.  Assessment and Plan:  1. Fatigue  - CBC with Diff -  COMPLETE METABOLIC PANEL WITH GFR - Magnesium - TSH  2. History of 2019 novel coronavirus disease (COVID-19)  - SAR CoV2 Serology (COVID 19)AB(IGG)IA to r/o false (+) Covid test  Follow Up Instructions: pending labs       I discussed the assessment and treatment plan with the patient. The patient was provided an opportunity to ask questions and all were answered. The patient agreed with the plan and demonstrated an understanding of the instructions.  The patient was advised to call back or seek an in-person evaluation if the symptoms worsen or if the condition fails to improve as anticipated.   Kirtland Bouchard, MD

## 2019-04-21 ENCOUNTER — Ambulatory Visit (INDEPENDENT_AMBULATORY_CARE_PROVIDER_SITE_OTHER): Payer: Managed Care, Other (non HMO) | Admitting: Internal Medicine

## 2019-04-21 ENCOUNTER — Encounter: Payer: Self-pay | Admitting: Internal Medicine

## 2019-04-21 ENCOUNTER — Other Ambulatory Visit: Payer: Self-pay

## 2019-04-21 VITALS — BP 108/60 | HR 72 | Temp 97.9°F | Resp 16 | Ht 69.0 in | Wt 171.2 lb

## 2019-04-21 DIAGNOSIS — Z8616 Personal history of COVID-19: Secondary | ICD-10-CM

## 2019-04-21 DIAGNOSIS — Z8619 Personal history of other infectious and parasitic diseases: Secondary | ICD-10-CM

## 2019-04-21 DIAGNOSIS — R5383 Other fatigue: Secondary | ICD-10-CM

## 2019-04-22 LAB — SAR COV2 SEROLOGY (COVID19)AB(IGG),IA: SARS CoV2 AB IGG: POSITIVE — AB

## 2019-04-22 LAB — CBC WITH DIFFERENTIAL/PLATELET
Absolute Monocytes: 685 cells/uL (ref 200–950)
Basophils Absolute: 53 cells/uL (ref 0–200)
Basophils Relative: 0.6 %
Eosinophils Absolute: 525 cells/uL — ABNORMAL HIGH (ref 15–500)
Eosinophils Relative: 5.9 %
HCT: 43.7 % (ref 38.5–50.0)
Hemoglobin: 14.7 g/dL (ref 13.2–17.1)
Lymphs Abs: 2305 cells/uL (ref 850–3900)
MCH: 29.1 pg (ref 27.0–33.0)
MCHC: 33.6 g/dL (ref 32.0–36.0)
MCV: 86.4 fL (ref 80.0–100.0)
MPV: 9.4 fL (ref 7.5–12.5)
Monocytes Relative: 7.7 %
Neutro Abs: 5331 cells/uL (ref 1500–7800)
Neutrophils Relative %: 59.9 %
Platelets: 303 10*3/uL (ref 140–400)
RBC: 5.06 10*6/uL (ref 4.20–5.80)
RDW: 12.6 % (ref 11.0–15.0)
Total Lymphocyte: 25.9 %
WBC: 8.9 10*3/uL (ref 3.8–10.8)

## 2019-04-22 LAB — COMPLETE METABOLIC PANEL WITH GFR
AG Ratio: 2.3 (calc) (ref 1.0–2.5)
ALT: 22 U/L (ref 9–46)
AST: 21 U/L (ref 10–40)
Albumin: 5 g/dL (ref 3.6–5.1)
Alkaline phosphatase (APISO): 56 U/L (ref 36–130)
BUN: 7 mg/dL (ref 7–25)
CO2: 28 mmol/L (ref 20–32)
Calcium: 9.8 mg/dL (ref 8.6–10.3)
Chloride: 103 mmol/L (ref 98–110)
Creat: 0.83 mg/dL (ref 0.60–1.35)
GFR, Est African American: 127 mL/min/{1.73_m2} (ref >=60)
GFR, Est Non African American: 109 mL/min/{1.73_m2} (ref >=60)
Globulin: 2.2 g/dL (calc) (ref 1.9–3.7)
Glucose, Bld: 83 mg/dL (ref 65–99)
Potassium: 4.5 mmol/L (ref 3.5–5.3)
Sodium: 139 mmol/L (ref 135–146)
Total Bilirubin: 0.4 mg/dL (ref 0.2–1.2)
Total Protein: 7.2 g/dL (ref 6.1–8.1)

## 2019-04-22 LAB — TSH: TSH: 1.19 mIU/L (ref 0.40–4.50)

## 2019-04-22 LAB — MAGNESIUM: Magnesium: 2.2 mg/dL (ref 1.5–2.5)

## 2019-04-27 ENCOUNTER — Telehealth: Payer: Self-pay | Admitting: *Deleted

## 2019-04-27 NOTE — Telephone Encounter (Signed)
Patient called and reported his Eosinophils were elevated on his CBC report.  Per Dr Melford Aase, they can be elevated due to any infection or allergies and he should not be concerned.  Patient is aware.

## 2019-05-05 ENCOUNTER — Other Ambulatory Visit: Payer: Self-pay | Admitting: Podiatry

## 2019-05-05 DIAGNOSIS — M25371 Other instability, right ankle: Secondary | ICD-10-CM

## 2019-05-05 DIAGNOSIS — S93491A Sprain of other ligament of right ankle, initial encounter: Secondary | ICD-10-CM

## 2019-05-06 ENCOUNTER — Ambulatory Visit (INDEPENDENT_AMBULATORY_CARE_PROVIDER_SITE_OTHER): Payer: Managed Care, Other (non HMO) | Admitting: Internal Medicine

## 2019-05-06 ENCOUNTER — Other Ambulatory Visit: Payer: Self-pay

## 2019-05-06 VITALS — BP 114/70 | HR 64 | Temp 97.0°F | Resp 16 | Ht 69.0 in | Wt 172.6 lb

## 2019-05-06 DIAGNOSIS — D485 Neoplasm of uncertain behavior of skin: Secondary | ICD-10-CM | POA: Diagnosis not present

## 2019-05-08 ENCOUNTER — Encounter: Payer: Self-pay | Admitting: Internal Medicine

## 2019-05-08 NOTE — Progress Notes (Signed)
   Subjective:    Patient ID: John Velez, male    DOB: 02/27/1976, 43 y.o.   MRN: [id number]   HPI     Patient presents with a non healing wound of the dorsal distal Left long finger.    Review of Systems     Objective:   Physical Exam  BP 114/70   Pulse 64   Temp (!) 97 F (36.1 C)   Resp 16   Ht 5' 9"  (1.753 m)   Wt 172 lb 9.6 oz (78.3 kg)   BMI 25.49 kg/m   HEENT - WNL. Neck - supple.  Chest - Clear equal BS. Cor - Nl HS. RRR w/o sig MGR. PP 1(+). No edema. MS- FROM w/o deformities.  Gait Nl. Neuro -  Nl w/o focal abnormalities. Skin - There is a  3 x 4 mm granulated ulceration over the dorsal distal Left long finger   Procedure 17000    After informed consent and alcohol prep the area was anesthetized with 0.6 ml of Marcaine 0.5 %. Then the area in question was sharply excised with a # 10 scalpel an the th e wound base was deeply cauderized with the hyfrecator. Antibiotic ung  & sterile bandage was  Applied. Patient was instructed in opo wound care.    Assessment & Plan:   1. Neoplasm of uncertain behavior of skin of Left Long Finger   - Hyfrecated

## 2019-05-19 ENCOUNTER — Other Ambulatory Visit: Payer: Self-pay

## 2019-05-19 ENCOUNTER — Ambulatory Visit
Admission: RE | Admit: 2019-05-19 | Discharge: 2019-05-19 | Disposition: A | Discharge status: Home / Self Care | Payer: Managed Care, Other (non HMO) | Source: Ambulatory Visit | Attending: Podiatry | Admitting: Podiatry

## 2019-05-19 DIAGNOSIS — M25371 Other instability, right ankle: Secondary | ICD-10-CM | POA: Insufficient documentation

## 2019-05-19 DIAGNOSIS — S93491A Sprain of other ligament of right ankle, initial encounter: Secondary | ICD-10-CM | POA: Insufficient documentation

## 2019-11-16 ENCOUNTER — Encounter: Payer: Self-pay | Admitting: Internal Medicine

## 2019-11-16 NOTE — Patient Instructions (Signed)

## 2019-11-16 NOTE — Progress Notes (Signed)
Annual  Screening/Preventative Visit  & Comprehensive Evaluation & Examination     This very nice 44 y.o.   MBM presents for a Screening /Preventative Visit & comprehensive evaluation and management of multiple medical co-morbidities.  Patient has been followed for labile HTN, HLD, Prediabetes and Vitamin D Deficiency. Patient does c/o allergy sx's with sneezing and itchy water eyes & nose. Also c/o epigastric burning or pyrosis, but no ss heartburn or reflux.      Patient has remote hx/o elevated BP and has been monitored expectantly for labile HTN. Patient's BP has been controlled at home.  Today's BP is at goal - 112/74. Patient denies any cardiac symptoms as chest pain, palpitations, shortness of breath, dizziness or ankle swelling.     Patient's hyperlipidemia is controlled with diet. Last lipids were at goal with minimally elevated Trig's:  Lab Results  Component Value Date   CHOL 164 11/12/2018   HDL 42 11/12/2018   LDLCALC 96 11/12/2018   TRIG 163 (H) 11/12/2018   CHOLHDL 3.9 11/12/2018       Patient has hx/o prediabetes (A1c 6.1% / 2011 & then 5.7% / 2013)and patient denies reactive hypoglycemic symptoms, visual blurring, diabetic polys or paresthesias. Last A1c was Normal & at goal:  Lab Results  Component Value Date   HGBA1C 5.5 11/12/2018           Patient has hx/o Low Testosterone  ( "222" / 2016 and "306" / 2015) and had declined replacement therapy in the past. Taking Zinc supplements, in May 2019 , his Testosterone was back up to Normal ("428" / May 2019) and Normal "326"  In June 2020.     Finally, patient has history of Vitamin D Deficiency("26" / 2010)  and last vitamin D was at goal:  Lab Results  Component Value Date   VD25OH 67 11/12/2018    Current Outpatient Medications on File Prior to Visit  Medication Sig  . aspirin EC 81 MG tablet Take 81 mg by mouth daily.  . cetirizine (ZYRTEC) 10 MG tablet Take 10 mg by mouth daily.  . Cholecalciferol  (VITAMIN D PO) Take 10,000 Int'l Units by mouth daily.   . Cinnamon 500 MG capsule Take 500 mg by mouth daily.  . Cyanocobalamin (VITAMIN B 12 PO) Take by mouth. Takes 1 tablet SL daily.  . fluticasone (FLONASE) 50 MCG/ACT nasal spray Place into both nostrils daily.  . Zinc 50 MG CAPS Take 1 capsule by mouth daily.   No current facility-administered medications on file prior to visit.   No Known Allergies  Past Medical History:  Diagnosis Date  . Prediabetes   . Vitamin D deficiency    Health Maintenance  Topic Date Due  . INFLUENZA VACCINE  01/10/2020  . TETANUS/TDAP  07/13/2025  . COVID-19 Vaccine  Completed  . Hepatitis C Screening  Completed  . HIV Screening  Completed   Immunization History  Administered Date(s) Administered  . Influenza Inj Mdck Quad With Preservative 05/01/2017, 04/25/2018  . PFIZER SARS-COV-2 Vaccination 09/01/2019, 09/22/2019  . PPD Test 08/28/2013, 07/06/2014, 09/25/2016, 10/18/2017, 11/12/2018, 11/17/2019  . Pneumococcal Polysaccharide-23 04/28/2008  . Td 06/11/2005  . Tdap 07/14/2015    Past Surgical History:  Procedure Laterality Date  . NO PAST SURGERIES     Family History  Problem Relation Age of Onset  . Diabetes Mother   . Cancer Mother        Breast  . Hypertension Father   . Hyperlipidemia Father   .  Cancer Father        Prostate  . Cancer Maternal Grandmother        lung  . Cancer Maternal Grandfather        lung   Social History   Socioeconomic History  . Marital status: Married    Spouse name: Not on file  . Number of children: 1 child  Occupational History  . Real estate sales  Tobacco Use  . Smoking status: Never Smoker  . Smokeless tobacco: Never Used  Substance and Sexual Activity  . Alcohol use: Yes    Comment: ocassional  . Drug use: No  . Sexual activity: Not on file     ROS Constitutional: Denies fever, chills, weight loss/gain, headaches, insomnia,  night sweats or change in appetite. Does c/o  fatigue. Eyes: Denies redness, blurred vision, diplopia, discharge, itchy or watery eyes.  ENT: Denies discharge, congestion, post nasal drip, epistaxis, sore throat, earache, hearing loss, dental pain, Tinnitus, Vertigo, Sinus pain or snoring.  Cardio: Denies chest pain, palpitations, irregular heartbeat, syncope, dyspnea, diaphoresis, orthopnea, PND, claudication or edema Respiratory: denies cough, dyspnea, DOE, pleurisy, hoarseness, laryngitis or wheezing.  Gastrointestinal: Denies dysphagia, heartburn, reflux, water brash, pain, cramps, nausea, vomiting, bloating, diarrhea, constipation, hematemesis, melena, hematochezia, jaundice or hemorrhoids Genitourinary: Denies dysuria, frequency, urgency, nocturia, hesitancy, discharge, hematuria or flank pain Musculoskeletal: Denies arthralgia, myalgia, stiffness, Jt. Swelling, pain, limp or strain/sprain. Denies Falls. Skin: Denies puritis, rash, hives, warts, acne, eczema or change in skin lesion Neuro: No weakness, tremor, incoordination, spasms, paresthesia or pain Psychiatric: Denies confusion, memory loss or sensory loss. Denies Depression. Endocrine: Denies change in weight, skin, hair change, nocturia, and paresthesia, diabetic polys, visual blurring or hyper / hypo glycemic episodes.  Heme/Lymph: No excessive bleeding, bruising or enlarged lymph nodes.  Physical Exam  BP 112/74   Pulse 64   Temp (!) 97.2 F (36.2 C)   Resp 16   Ht 5' 9"  (1.753 m)   Wt 170 lb 6.4 oz (77.3 kg)   BMI 25.16 kg/m   General Appearance: Well nourished and well groomed and in no apparent distress.  Eyes: PERRLA, EOMs, conjunctiva no swelling or erythema, normal fundi and vessels. Sinuses: No frontal/maxillary tenderness ENT/Mouth: EACs patent / TMs  nl. Nares clear without erythema, swelling, mucoid exudates. Oral hygiene is good. No erythema, swelling, or exudate. Tongue normal, non-obstructing. Tonsils not swollen or erythematous. Hearing normal.  Neck:  Supple, thyroid not palpable. No bruits, nodes or JVD. Respiratory: Respiratory effort normal.  BS equal and clear bilateral without rales, rhonci, wheezing or stridor. Cardio: Heart sounds are normal with regular rate and rhythm and no murmurs, rubs or gallops. Peripheral pulses are normal and equal bilaterally without edema. No aortic or femoral bruits. Chest: symmetric with normal excursions and percussion.  Abdomen: Soft, with Nl bowel sounds. Nontender, no guarding, rebound, hernias, masses, or organomegaly.  Lymphatics: Non tender without lymphadenopathy.  Musculoskeletal: Full ROM all peripheral extremities, joint stability, 5/5 strength, and normal gait. Skin: Warm and dry without rashes, lesions, cyanosis, clubbing or  ecchymosis.  Neuro: Cranial nerves intact, reflexes equal bilaterally. Normal muscle tone, no cerebellar symptoms. Sensation intact.  Pysch: Alert and oriented X 3 with normal affect, insight and judgment appropriate.   Assessment and Plan  1. Annual Preventative/Screening Exam   2. Elevated BP without diagnosis of hypertension  - EKG 12-Lead - Microalbumin / creatinine urine ratio - Vitamin B12 - CBC with Differential/Platelet - COMPLETE METABOLIC PANEL WITH GFR - Magnesium -  TSH  3. Hyperlipidemia, mixed  - EKG 12-Lead - Lipid panel - TSH  4. Abnormal glucose  - EKG 12-Lead - Hemoglobin A1c - Insulin, random  5. Vitamin D deficiency  - VITAMIN D 25 Hydroxy  6. Testosterone deficiency   7. Screening for colorectal cancer  - POC Hemoccult Bld/Stl   8. Benign prostatic hyperplasia without lower urinary tract symptoms  - PSA  9. Prostate cancer screening  - PSA  10. Screening examination for pulmonary tuberculosis  - TB Skin Test  11. Screening for ischemic heart disease  - EKG 12-Lead  12. FH: hypertension  - EKG 12-Lead  13. Fatigue  - Iron,Total/Total Iron Binding Cap - CBC with Differential/Platelet - TSH  14.  Medication management  - Urinalysis, Routine w reflex microscopic - Microalbumin / creatinine urine ratio - TB Skin Test - CBC with Differential/Platelet - COMPLETE METABOLIC PANEL WITH GFR - Magnesium - Lipid panel - TSH - Hemoglobin A1c - Insulin, random - VITAMIN D 25 Hydrox        Patient was counseled in prudent diet, weight control to achieve/maintain BMI less than 25, BP monitoring, regular exercise and medications as discussed.  Discussed med effects and SE's. Routine screening labs and tests as requested with regular follow-up as recommended. Over 40 minutes of exam, counseling, chart review and high complex critical decision making was performed   Kirtland Bouchard, MD

## 2019-11-17 ENCOUNTER — Other Ambulatory Visit: Payer: Self-pay

## 2019-11-17 ENCOUNTER — Ambulatory Visit (INDEPENDENT_AMBULATORY_CARE_PROVIDER_SITE_OTHER): Payer: 59 | Admitting: Internal Medicine

## 2019-11-17 VITALS — BP 112/74 | HR 64 | Temp 97.2°F | Resp 16 | Ht 69.0 in | Wt 170.4 lb

## 2019-11-17 DIAGNOSIS — J3089 Other allergic rhinitis: Secondary | ICD-10-CM

## 2019-11-17 DIAGNOSIS — Z13220 Encounter for screening for lipoid disorders: Secondary | ICD-10-CM

## 2019-11-17 DIAGNOSIS — Z8249 Family history of ischemic heart disease and other diseases of the circulatory system: Secondary | ICD-10-CM

## 2019-11-17 DIAGNOSIS — Z111 Encounter for screening for respiratory tuberculosis: Secondary | ICD-10-CM | POA: Diagnosis not present

## 2019-11-17 DIAGNOSIS — Z0001 Encounter for general adult medical examination with abnormal findings: Secondary | ICD-10-CM

## 2019-11-17 DIAGNOSIS — R030 Elevated blood-pressure reading, without diagnosis of hypertension: Secondary | ICD-10-CM | POA: Diagnosis not present

## 2019-11-17 DIAGNOSIS — Z0000 Encounter for general adult medical examination without abnormal findings: Secondary | ICD-10-CM

## 2019-11-17 DIAGNOSIS — Z136 Encounter for screening for cardiovascular disorders: Secondary | ICD-10-CM

## 2019-11-17 DIAGNOSIS — E559 Vitamin D deficiency, unspecified: Secondary | ICD-10-CM

## 2019-11-17 DIAGNOSIS — Z125 Encounter for screening for malignant neoplasm of prostate: Secondary | ICD-10-CM | POA: Diagnosis not present

## 2019-11-17 DIAGNOSIS — Z79899 Other long term (current) drug therapy: Secondary | ICD-10-CM | POA: Diagnosis not present

## 2019-11-17 DIAGNOSIS — R350 Frequency of micturition: Secondary | ICD-10-CM

## 2019-11-17 DIAGNOSIS — R5383 Other fatigue: Secondary | ICD-10-CM

## 2019-11-17 DIAGNOSIS — Z131 Encounter for screening for diabetes mellitus: Secondary | ICD-10-CM | POA: Diagnosis not present

## 2019-11-17 DIAGNOSIS — Z1389 Encounter for screening for other disorder: Secondary | ICD-10-CM

## 2019-11-17 DIAGNOSIS — R12 Heartburn: Secondary | ICD-10-CM

## 2019-11-17 DIAGNOSIS — Z1212 Encounter for screening for malignant neoplasm of rectum: Secondary | ICD-10-CM

## 2019-11-17 DIAGNOSIS — N401 Enlarged prostate with lower urinary tract symptoms: Secondary | ICD-10-CM | POA: Diagnosis not present

## 2019-11-17 DIAGNOSIS — R7309 Other abnormal glucose: Secondary | ICD-10-CM

## 2019-11-17 DIAGNOSIS — Z130 Encounter for screening for diseases of the blood and blood-forming organs and certain disorders involving the immune mechanism: Secondary | ICD-10-CM | POA: Diagnosis not present

## 2019-11-17 DIAGNOSIS — N400 Enlarged prostate without lower urinary tract symptoms: Secondary | ICD-10-CM

## 2019-11-17 DIAGNOSIS — E782 Mixed hyperlipidemia: Secondary | ICD-10-CM

## 2019-11-17 DIAGNOSIS — E349 Endocrine disorder, unspecified: Secondary | ICD-10-CM

## 2019-11-17 MED ORDER — PANTOPRAZOLE SODIUM 40 MG PO TBEC: DELAYED_RELEASE_TABLET | ORAL | 3 refills | Status: DC

## 2019-11-17 MED ORDER — MONTELUKAST SODIUM 10 MG PO TABS: ORAL_TABLET | ORAL | 3 refills | Status: DC

## 2019-11-18 LAB — IRON, TOTAL/TOTAL IRON BINDING CAP
%SAT: 40 % (calc) (ref 20–48)
Iron: 149 ug/dL (ref 50–180)
TIBC: 372 mcg/dL (calc) (ref 250–425)

## 2019-11-18 LAB — COMPLETE METABOLIC PANEL WITH GFR
AG Ratio: 2 (calc) (ref 1.0–2.5)
ALT: 15 U/L (ref 9–46)
AST: 17 U/L (ref 10–40)
Albumin: 4.7 g/dL (ref 3.6–5.1)
Alkaline phosphatase (APISO): 76 U/L (ref 36–130)
BUN: 11 mg/dL (ref 7–25)
CO2: 26 mmol/L (ref 20–32)
Calcium: 9.9 mg/dL (ref 8.6–10.3)
Chloride: 104 mmol/L (ref 98–110)
Creat: 1.04 mg/dL (ref 0.60–1.35)
GFR, Est African American: 102 mL/min/{1.73_m2} (ref >=60)
GFR, Est Non African American: 88 mL/min/{1.73_m2} (ref >=60)
Globulin: 2.3 g/dL (calc) (ref 1.9–3.7)
Glucose, Bld: 97 mg/dL (ref 65–99)
Potassium: 4.1 mmol/L (ref 3.5–5.3)
Sodium: 139 mmol/L (ref 135–146)
Total Bilirubin: 0.6 mg/dL (ref 0.2–1.2)
Total Protein: 7 g/dL (ref 6.1–8.1)

## 2019-11-18 LAB — CBC WITH DIFFERENTIAL/PLATELET
Absolute Monocytes: 664 cells/uL (ref 200–950)
Basophils Absolute: 79 cells/uL (ref 0–200)
Basophils Relative: 1 %
Eosinophils Absolute: 514 cells/uL — ABNORMAL HIGH (ref 15–500)
Eosinophils Relative: 6.5 %
HCT: 45 % (ref 38.5–50.0)
Hemoglobin: 14.7 g/dL (ref 13.2–17.1)
Lymphs Abs: 1485 cells/uL (ref 850–3900)
MCH: 28.9 pg (ref 27.0–33.0)
MCHC: 32.7 g/dL (ref 32.0–36.0)
MCV: 88.4 fL (ref 80.0–100.0)
MPV: 9.5 fL (ref 7.5–12.5)
Monocytes Relative: 8.4 %
Neutro Abs: 5159 cells/uL (ref 1500–7800)
Neutrophils Relative %: 65.3 %
Platelets: 290 10*3/uL (ref 140–400)
RBC: 5.09 10*6/uL (ref 4.20–5.80)
RDW: 12.5 % (ref 11.0–15.0)
Total Lymphocyte: 18.8 %
WBC: 7.9 10*3/uL (ref 3.8–10.8)

## 2019-11-18 LAB — URINALYSIS, ROUTINE W REFLEX MICROSCOPIC
Bilirubin Urine: NEGATIVE
Glucose, UA: NEGATIVE
Hgb urine dipstick: NEGATIVE
Leukocytes,Ua: NEGATIVE
Nitrite: NEGATIVE
Protein, ur: NEGATIVE
Specific Gravity, Urine: 1.024 (ref 1.001–1.03)
pH: 5.5 (ref 5.0–8.0)

## 2019-11-18 LAB — MICROALBUMIN / CREATININE URINE RATIO
Creatinine, Urine: 250 mg/dL (ref 20–320)
Microalb Creat Ratio: 3 mcg/mg creat (ref <30)
Microalb, Ur: 0.7 mg/dL

## 2019-11-18 LAB — MAGNESIUM: Magnesium: 2.1 mg/dL (ref 1.5–2.5)

## 2019-11-18 LAB — LIPID PANEL
Cholesterol: 198 mg/dL (ref <200)
HDL: 43 mg/dL (ref >=40)
LDL Cholesterol (Calc): 127 mg/dL (calc) — ABNORMAL HIGH
Non-HDL Cholesterol (Calc): 155 mg/dL (calc) — ABNORMAL HIGH (ref <130)
Total CHOL/HDL Ratio: 4.6 (calc) (ref <5.0)
Triglycerides: 168 mg/dL — ABNORMAL HIGH (ref <150)

## 2019-11-18 LAB — PSA: PSA: 0.2 ng/mL (ref <=4.0)

## 2019-11-18 LAB — INSULIN, RANDOM: Insulin: 9.8 u[IU]/mL

## 2019-11-18 LAB — VITAMIN D 25 HYDROXY (VIT D DEFICIENCY, FRACTURES): Vit D, 25-Hydroxy: 73 ng/mL (ref 30–100)

## 2019-11-18 LAB — HEMOGLOBIN A1C
Hgb A1c MFr Bld: 5.1 % of total Hgb (ref <5.7)
Mean Plasma Glucose: 100 (calc)
eAG (mmol/L): 5.5 (calc)

## 2019-11-18 LAB — TSH: TSH: 1.03 mIU/L (ref 0.40–4.50)

## 2019-11-18 LAB — VITAMIN B12: Vitamin B-12: 513 pg/mL (ref 200–1100)

## 2019-11-20 LAB — TB SKIN TEST
Induration: 0 mm
TB Skin Test: NEGATIVE

## 2019-11-30 ENCOUNTER — Encounter: Payer: Self-pay | Admitting: Physician Assistant

## 2019-11-30 ENCOUNTER — Other Ambulatory Visit: Payer: Self-pay

## 2019-11-30 ENCOUNTER — Encounter: Payer: Self-pay | Admitting: Internal Medicine

## 2019-11-30 ENCOUNTER — Ambulatory Visit (INDEPENDENT_AMBULATORY_CARE_PROVIDER_SITE_OTHER): Payer: 59 | Admitting: Internal Medicine

## 2019-11-30 VITALS — BP 104/68 | HR 64 | Temp 96.7°F | Resp 18 | Ht 69.0 in | Wt 170.8 lb

## 2019-11-30 DIAGNOSIS — K625 Hemorrhage of anus and rectum: Secondary | ICD-10-CM

## 2019-11-30 DIAGNOSIS — R12 Heartburn: Secondary | ICD-10-CM | POA: Diagnosis not present

## 2019-11-30 LAB — CBC WITH DIFFERENTIAL/PLATELET
Absolute Monocytes: 668 cells/uL (ref 200–950)
Basophils Absolute: 68 cells/uL (ref 0–200)
Basophils Relative: 0.9 %
Eosinophils Absolute: 473 cells/uL (ref 15–500)
Eosinophils Relative: 6.3 %
HCT: 43.9 % (ref 38.5–50.0)
Hemoglobin: 14.7 g/dL (ref 13.2–17.1)
Lymphs Abs: 1628 cells/uL (ref 850–3900)
MCH: 29.9 pg (ref 27.0–33.0)
MCHC: 33.5 g/dL (ref 32.0–36.0)
MCV: 89.2 fL (ref 80.0–100.0)
MPV: 9.4 fL (ref 7.5–12.5)
Monocytes Relative: 8.9 %
Neutro Abs: 4665 cells/uL (ref 1500–7800)
Neutrophils Relative %: 62.2 %
Platelets: 328 10*3/uL (ref 140–400)
RBC: 4.92 10*6/uL (ref 4.20–5.80)
RDW: 12.2 % (ref 11.0–15.0)
Total Lymphocyte: 21.7 %
WBC: 7.5 10*3/uL (ref 3.8–10.8)

## 2019-11-30 NOTE — Progress Notes (Signed)
    History of Present Illness:  This very nice 44 yo MBM seen 2 weeks ago with c/o EG discomfort which persists despite starting Pantoprazole. Now patient presents with c/o intermittent hematochezia .   Medications  .  cetirizine (ZYRTEC) 10 MG tablet, Take 10 mg by mouth daily. .  fluticasone (FLONASE) 50 MCG/ACT nasal spray, Place into both nostrils daily. .  montelukast (SINGULAIR) 10 MG tablet, Take 1 tablet daily for Allergies  .  aspirin EC 81 MG tablet, Take 81 mg by mouth daily. .  Cyanocobalamin (VITAMIN B 12 PO), Take by mouth. Takes 1 tablet SL daily. .  Cholecalciferol (VITAMIN D PO), Take 10,000 Int'l Units by mouth daily.  .  Cinnamon 500 MG capsule, Take 500 mg by mouth daily. .  pantoprazole (PROTONIX) 40 MG tablet, Take 1 tablet Daily for Indigestion & Acid Reflux .  Zinc 50 MG CAPS, Take 1 capsule by mouth daily.  Problem list He has Vitamin D deficiency; Prediabetes; Testosterone deficiency; Hyperlipidemia (LDL Chol); Labile hypertension; and Medication management on their problem list.   Observations/Objective:   BP 104/68   Pulse 64   Temp (!) 96.7 F (35.9 C)   Resp 18   Ht 5' 9"  (1.753 m)   Wt 170 lb 12.8 oz (77.5 kg)   BMI 25.22 kg/m   HEENT - WNL. Neck - supple.  Chest - Clear equal BS. Cor - Nl HS. RRR w/o sig MGR. PP 1(+). No edema. Abd - (+) EG tenderness w/o guarding or rebound. No masses. BS Nl.   GU -  No Ext hemorrhoids or fissures evident. DRE w/o Int hemorrhoid or masses. Stool light brown with pink tinged mucoid discharge testing strongly hemoccult (+)   MS- FROM w/o deformities.  Gait Nl. Neuro -  Nl w/o focal abnormalities.  Assessment and Plan:  1. Rectal bleed  - CBC with Differential/Platelet - Ambulatory referral to Gastroenterology  2. Pyrosis  - Ambulatory referral to Gastroenterology         I discussed the assessment and treatment plan with the patient. The patient was provided an opportunity to ask questions and  all were answered. The patient agreed with the plan and demonstrated an understanding of the instructions.    Kirtland Bouchard, MD

## 2019-12-01 NOTE — Progress Notes (Signed)
===========================================================  -   CBC - Normal - OK - No sign of significant blood loss  ===========================================================

## 2019-12-10 ENCOUNTER — Ambulatory Visit (INDEPENDENT_AMBULATORY_CARE_PROVIDER_SITE_OTHER): Payer: 59 | Admitting: Physician Assistant

## 2019-12-10 ENCOUNTER — Encounter: Payer: Self-pay | Admitting: Physician Assistant

## 2019-12-10 VITALS — BP 106/78 | HR 74 | Ht 69.0 in | Wt 170.0 lb

## 2019-12-10 DIAGNOSIS — K921 Melena: Secondary | ICD-10-CM | POA: Diagnosis not present

## 2019-12-10 DIAGNOSIS — R194 Change in bowel habit: Secondary | ICD-10-CM

## 2019-12-10 MED ORDER — SUTAB 1479-225-188 MG PO TABS: 1.0000 | ORAL_TABLET | Freq: Once | ORAL | 0 refills | Status: AC

## 2019-12-10 NOTE — Progress Notes (Signed)
Reviewed and agree with documentation and assessment and plan. K. Veena Meryle Pugmire , MD   

## 2019-12-10 NOTE — Patient Instructions (Signed)
If you are age 42 or older, your body mass index should be between 23-30. Your Body mass index is 25.1 kg/m. If this is out of the aforementioned range listed, please consider follow up with your Primary Care Provider.  If you are age 96 or younger, your body mass index should be between 19-25. Your Body mass index is 25.1 kg/m. If this is out of the aformentioned range listed, please consider follow up with your Primary Care Provider.   You have been scheduled for a colonoscopy. Please follow written instructions given to you at your visit today.  Please pick up your prep supplies at the pharmacy within the next 1-3 days. If you use inhalers (even only as needed), please bring them with you on the day of your procedure.   Due to recent changes in healthcare laws, you may see the results of your imaging and laboratory studies on MyChart before your provider has had a chance to review them.  We understand that in some cases there may be results that are confusing or concerning to you. Not all laboratory results come back in the same time frame and the provider may be waiting for multiple results in order to interpret others.  Please give [place] 48 hours in order for your provider to thoroughly review all the results before contacting the office for clarification of your results.

## 2019-12-10 NOTE — Progress Notes (Signed)
Chief Complaint: Rectal bleeding  HPI:    John Velez is a 44 year old male with a past medical history as listed below, who was referred to me by Unk Pinto, MD for a complaint of rectal bleeding.      11/30/2019 patient seen by PCP and described intermittent hematochezia.  Also described continued epigastric discomfort despite starting pantoprazole 2 weeks prior.  Stools were Hemoccult positive in the office, no external hemorrhoids or fissures evident.    11/30/2019 CBC was normal.    Today, the patient presents to clinic and tells me that October of last year he had Covid and during that time had some blood in his stool, it stopped after the Covid went away so he thought it was just related.  But within the past month has started again with bright red blood in his stool every time he has a bowel movement, it is mixed in with the stool and on the toilet paper, has also had a change in bowel habits with sometimes loose stool and mucus and sometimes just blood.  Explains that his PCP did a rectal exam and could not find the source so recommended that he come see [place].    Denies fever, chills, weight loss or symptoms that awaken him from sleep.  Past Medical History:  Diagnosis Date  . Prediabetes   . Vitamin D deficiency     Past Surgical History:  Procedure Laterality Date  . NO PAST SURGERIES      Current Outpatient Medications  Medication Sig Dispense Refill  . aspirin EC 81 MG tablet Take 81 mg by mouth daily.    . cetirizine (ZYRTEC) 10 MG tablet Take 10 mg by mouth daily.    . Cholecalciferol (VITAMIN D PO) Take 10,000 Int'l Units by mouth daily.     . Cinnamon 500 MG capsule Take 500 mg by mouth daily.    . Cyanocobalamin (VITAMIN B 12 PO) Take by mouth. Takes 1 tablet SL daily.    . fluticasone (FLONASE) 50 MCG/ACT nasal spray Place into both nostrils daily.    . montelukast (SINGULAIR) 10 MG tablet Take 1 tablet daily for Allergies 90 tablet 3  . pantoprazole (PROTONIX) 40  MG tablet Take 1 tablet Daily for Indigestion & Acid Reflux 90 tablet 3  . Zinc 50 MG CAPS Take 1 capsule by mouth daily.     No current facility-administered medications for this visit.    Allergies as of 12/10/2019  . (No Known Allergies)    Family History  Problem Relation Age of Onset  . Diabetes Mother   . Cancer Mother        Breast  . Hypertension Father   . Hyperlipidemia Father   . Cancer Father        Prostate  . Cancer Maternal Grandmother        lung  . Cancer Maternal Grandfather        lung    Social History   Socioeconomic History  . Marital status: Married    Spouse name: Not on file  . Number of children: Not on file  . Years of education: Not on file  . Highest education level: Not on file  Occupational History  . Not on file  Tobacco Use  . Smoking status: Never Smoker  . Smokeless tobacco: Never Used  Substance and Sexual Activity  . Alcohol use: Yes    Comment: ocassional  . Drug use: No  . Sexual activity: Not on file  Other Topics Concern  . Not on file  Social History Narrative  . Not on file   Social Determinants of Health   Financial Resource Strain:   . Difficulty of Paying Living Expenses:   Food Insecurity:   . Worried About Charity fundraiser in the Last Year:   . Arboriculturist in the Last Year:   Transportation Needs:   . Film/video editor (Medical):   Marland Kitchen Lack of Transportation (Non-Medical):   Physical Activity:   . Days of Exercise per Week:   . Minutes of Exercise per Session:   Stress:   . Feeling of Stress :   Social Connections:   . Frequency of Communication with Friends and Family:   . Frequency of Social Gatherings with Friends and Family:   . Attends Religious Services:   . Active Member of Clubs or Organizations:   . Attends Archivist Meetings:   Marland Kitchen Marital Status:   Intimate Partner Violence:   . Fear of Current or Ex-Partner:   . Emotionally Abused:   Marland Kitchen Physically Abused:   .  Sexually Abused:     Review of Systems:    Constitutional: No weight loss, fever or chills Skin: No rash  Cardiovascular: No chest pain Respiratory: No SOB Gastrointestinal: See HPI and otherwise negative Genitourinary: No dysuria  Neurological: No headache, dizziness or syncope Musculoskeletal: No new muscle or joint pain Hematologic: No bruising Psychiatric: No history of depression or anxiety   Physical Exam:  Vital signs: BP 106/78   Pulse 74   Ht 5' 9"  (1.753 m)   Wt 170 lb (77.1 kg)   BMI 25.10 kg/m   Constitutional:   Pleasant male appears to be in NAD, Well developed, Well nourished, alert and cooperative Head:  Normocephalic and atraumatic. Eyes:   PEERL, EOMI. No icterus. Conjunctiva pink. Ears:  Normal auditory acuity. Neck:  Supple Throat: Oral cavity and pharynx without inflammation, swelling or lesion.  Respiratory: Respirations even and unlabored. Lungs clear to auscultation bilaterally.   No wheezes, crackles, or rhonchi.  Cardiovascular: Normal S1, S2. No MRG. Regular rate and rhythm. No peripheral edema, cyanosis or pallor.  Gastrointestinal:  Soft, nondistended, mild epigastric and RLQ ttp. No rebound or guarding. Normal bowel sounds. No appreciable masses or hepatomegaly. Rectal: Declined-PCP just did a rectal exam which was unrevealing Msk:  Symmetrical without gross deformities. Without edema, no deformity or joint abnormality.  Neurologic:  Alert and  oriented x4;  grossly normal neurologically.  Skin:   Dry and intact without significant lesions or rashes. Psychiatric: Demonstrates good judgement and reason without abnormal affect or behaviors.  RELEVANT LABS AND IMAGING: CBC    Component Value Date/Time   WBC 7.5 11/30/2019 0948   RBC 4.92 11/30/2019 0948   HGB 14.7 11/30/2019 0948   HCT 43.9 11/30/2019 0948   PLT 328 11/30/2019 0948   MCV 89.2 11/30/2019 0948   MCH 29.9 11/30/2019 0948   MCHC 33.5 11/30/2019 0948   RDW 12.2 11/30/2019 0948     RDW 13.3 09/01/2013 0900   LYMPHSABS 1,628 11/30/2019 0948   LYMPHSABS 2.1 09/01/2013 0900   MONOABS 608 09/25/2016 1107   EOSABS 473 11/30/2019 0948   EOSABS 0.6 (H) 09/01/2013 0900   BASOSABS 68 11/30/2019 0948   BASOSABS 0.0 09/01/2013 0900    CMP     Component Value Date/Time   NA 139 11/17/2019 0858   NA 144 09/01/2013 0900   K 4.1 11/17/2019 0858  CL 104 11/17/2019 0858   CO2 26 11/17/2019 0858   GLUCOSE 97 11/17/2019 0858   BUN 11 11/17/2019 0858   BUN 10 09/01/2013 0900   CREATININE 1.04 11/17/2019 0858   CALCIUM 9.9 11/17/2019 0858   PROT 7.0 11/17/2019 0858   PROT 7.6 09/01/2013 0900   ALBUMIN 4.7 04/25/2017 1551   ALBUMIN 5.0 09/01/2013 0900   AST 17 11/17/2019 0858   ALT 15 11/17/2019 0858   ALKPHOS 54 04/25/2017 1551   BILITOT 0.6 11/17/2019 0858   GFRNONAA 88 11/17/2019 0858   GFRAA 102 11/17/2019 0858    Assessment: 1.  Hematochezia: Present in October of last year, then went away until 1 month ago, also a change in bowel habits towards looser stool, rectal exam by PCP was negative/normal; consider hemorrhoids versus polyp versus other 2.  Change in bowel habits: Towards looser stool, sometimes with mucus  Plan: 1.  Scheduled patient for diagnostic colonoscopy in the [place] Dr. Silverio Decamp.  Did discuss risks, benefits, limitations and alternatives and the patient agrees to proceed.  He has already had both of his Covid vaccines. 2.  Patient to follow in clinic per recommendations from Dr. Silverio Decamp after time of procedure.  Ellouise Newer, PA-C [facility] 12/10/2019, 9:02 AM  Cc: Unk Pinto, MD

## 2019-12-21 ENCOUNTER — Telehealth: Payer: Self-pay

## 2019-12-21 NOTE — Telephone Encounter (Signed)
Called patient and he states he is having more bright red blood from his rectum than what he was having at his 12/10/19 office visit. States anything he eats goes straight through him and the blood sometimes comes out by itself, instead of mixed in with stool. Denies fever, SOB or dizziness but does say he has been more tired. CBC on 11/30/19 was normal. He would like to get his Colonoscopy done sooner. Please advise.

## 2019-12-21 NOTE — Telephone Encounter (Signed)
Okay to move the colonoscopy for a sooner appointment, can be scheduled in EGD spot.  Thank you

## 2019-12-22 ENCOUNTER — Other Ambulatory Visit: Payer: Self-pay

## 2019-12-22 MED ORDER — SUTAB 1479-225-188 MG PO TABS: 1.0000 | ORAL_TABLET | Freq: Once | ORAL | 0 refills | Status: AC

## 2019-12-22 NOTE — Telephone Encounter (Signed)
Rescheduled patient for a sooner colonoscopy due to worsening sysmtoms (per Dr. Silverio Decamp). Scheduled on 12/25/19. Sutab sent to Pharmacy and new letter sent via MyChart with new Prep instructions (also gave verbally)

## 2019-12-23 ENCOUNTER — Ambulatory Visit: Payer: 59 | Admitting: Gastroenterology

## 2019-12-25 ENCOUNTER — Ambulatory Visit (AMBULATORY_SURGERY_CENTER): Payer: 59 | Admitting: Gastroenterology

## 2019-12-25 ENCOUNTER — Telehealth: Payer: Self-pay

## 2019-12-25 ENCOUNTER — Encounter: Payer: Self-pay | Admitting: Gastroenterology

## 2019-12-25 ENCOUNTER — Other Ambulatory Visit: Payer: Self-pay

## 2019-12-25 VITALS — BP 102/54 | HR 60 | Temp 96.6°F | Resp 16 | Ht 69.0 in | Wt 170.0 lb

## 2019-12-25 DIAGNOSIS — K921 Melena: Secondary | ICD-10-CM

## 2019-12-25 DIAGNOSIS — K51211 Ulcerative (chronic) proctitis with rectal bleeding: Secondary | ICD-10-CM

## 2019-12-25 MED ORDER — MESALAMINE 1.2 G PO TBEC: 1.2000 g | DELAYED_RELEASE_TABLET | Freq: Every day | ORAL | 3 refills | Status: DC

## 2019-12-25 MED ORDER — MESALAMINE 1000 MG RE SUPP
1000.0000 mg | Freq: Every day | RECTAL | 1 refills | Status: DC
Start: 2019-12-25 — End: 2020-03-24

## 2019-12-25 MED ORDER — SODIUM CHLORIDE 0.9 % IV SOLN: 500.0000 mL | Freq: Once | INTRAVENOUS | Status: DC

## 2019-12-25 MED ORDER — PREDNISONE 10 MG PO TABS: 20.0000 mg | ORAL_TABLET | Freq: Every day | ORAL | 0 refills | Status: DC

## 2019-12-25 MED ORDER — PREDNISONE 5 MG PO TABS: 5.0000 mg | ORAL_TABLET | Freq: Every day | ORAL | 0 refills | Status: DC

## 2019-12-25 NOTE — Op Note (Signed)
[facility] Patient Name: John Velez Procedure Date: 12/25/2019 1:33 PM MRN: [id number] Endoscopist: Mauri Pole , MD Age: 44 Referring MD:  Date of Birth: 07/31/1975 Gender: Male Account #: [id number] Procedure:                Colonoscopy Indications:              Evaluation of unexplained GI bleeding presenting                            with Hematochezia Medicines:                Monitored Anesthesia Care Procedure:                Pre-Anesthesia Assessment:                           - Prior to the procedure, a History and Physical                            was performed, and patient medications and                            allergies were reviewed. The patient's tolerance of                            previous anesthesia was also reviewed. The risks                            and benefits of the procedure and the sedation                            options and risks were discussed with the patient.                            All questions were answered, and informed consent                            was obtained. Prior Anticoagulants: The patient has                            taken no previous anticoagulant or antiplatelet                            agents. ASA Grade Assessment: II - A patient with                            mild systemic disease. After reviewing the risks                            and benefits, the patient was deemed in                            satisfactory condition to undergo the procedure.  After obtaining informed consent, the colonoscope                            was passed under direct vision. Throughout the                            procedure, the patient's blood pressure, pulse, and                            oxygen saturations were monitored continuously. The                            Colonoscope was introduced through the anus and                            advanced to the the terminal ileum, with                             identification of the appendiceal orifice and IC                            valve. The colonoscopy was performed without                            difficulty. The patient tolerated the procedure                            well. The quality of the bowel preparation was                            good. The terminal ileum, ileocecal valve,                            appendiceal orifice, and rectum were photographed. Scope In: 1:36:57 PM Scope Out: 1:57:05 PM Scope Withdrawal Time: 0 hours 14 minutes 54 seconds  Total Procedure Duration: 0 hours 20 minutes 8 seconds  Findings:                 The perianal and digital rectal examinations were                            normal.                           The terminal ileum contained a few ulcers. No                            bleeding was present. Stigmata of recent bleeding                            were present. Biopsies were taken with a cold                            forceps for histology.  A continuous area of bleeding ulcerated mucosa with                            stigmata of recent bleeding was present in the                            rectum. Biopsies were taken with a cold forceps for                            histology.                           Normal mucosa was found in the cecum, sigmoid,                            descending, transverse and ascending colon.                            Biopsies were taken with a cold forceps for                            histology. Complications:            No immediate complications. Estimated Blood Loss:     Estimated blood loss was minimal. Impression:               - A few ulcers in the terminal ileum. Biopsied.                           - Mucosal ulceration. Biopsied.                           - Normal mucosa in the sigmoid colon. Biopsied. Recommendation:           - Patient has a contact number available for                             emergencies. The signs and symptoms of potential                            delayed complications were discussed with the                            patient. Return to normal activities tomorrow.                            Written discharge instructions were provided to the                            patient.                           - Resume previous diet.                           - Continue present medications.                           -  Await pathology results.                           - Repeat colonoscopy in 10 years for screening                            purposes.                           - Return to GI clinic in 6-8 weeks.                           - Use Lialda 1.2 gm at 2 tabs PO daily indefinitely.                           - Use prednisone 20 mg PO once a day for 2 weeks                            followed by taper Prednisone 100m daily X 2 week,                            Prednisone 19mdaily X2 weeks and Prednisone 9m50m                          daily X2 weeks.                           - Use Canasa 1000 mg suppository 1 per rectum QHS                            for 2 weeks. KavMauri PoleD 12/25/2019 2:08:32 PM This report has been signed electronically.

## 2019-12-25 NOTE — Patient Instructions (Addendum)
Impression/Recommendations:  Resume previous diet. Continue present medications. Await pathology results.  Repeat colonoscopy in 10 years for screening purposes.  Return to GI clinic in 6-8 weeks.  Use Lialda 1.2 gm at 2 tablets by mouth daily indefinitely. Prednisone 20 mg. Once a day for 2 weeks, followed by taper Prednisone 15 mg daily for 2 weeks Prednisone 10 mg daily for 2 weeks, then Prednisone 5 mg. Daily for 2 weeks. Use Canasa 1000 mg. Suppository 1 per rectum every night for 2 weeks.  YOU HAD AN ENDOSCOPIC PROCEDURE TODAY AT [facility]:   Refer to the procedure report that was given to you for any specific questions about what was found during the examination.  If the procedure report does not answer your questions, please call your gastroenterologist to clarify.  If you requested that your care partner not be given the details of your procedure findings, then the procedure report has been included in a sealed envelope for you to review at your convenience later.  YOU SHOULD EXPECT: Some feelings of bloating in the abdomen. Passage of more gas than usual.  Walking can help get rid of the air that was put into your GI tract during the procedure and reduce the bloating. If you had a lower endoscopy (such as a colonoscopy or flexible sigmoidoscopy) you may notice spotting of blood in your stool or on the toilet paper. If you underwent a bowel prep for your procedure, you may not have a normal bowel movement for a few days.  Please Note:  You might notice some irritation and congestion in your nose or some drainage.  This is from the oxygen used during your procedure.  There is no need for concern and it should clear up in a day or so.  SYMPTOMS TO REPORT IMMEDIATELY:   Following lower endoscopy (colonoscopy or flexible sigmoidoscopy):  Excessive amounts of blood in the stool  Significant tenderness or worsening of abdominal pains  Swelling of the abdomen that is  new, acute  Fever of 100F or higher For urgent or emergent issues, a gastroenterologist can be reached at any hour by calling [phone]. Do not use MyChart messaging for urgent concerns.    DIET:  We do recommend a small meal at first, but then you may proceed to your regular diet.  Drink plenty of fluids but you should avoid alcoholic beverages for 24 hours.  ACTIVITY:  You should plan to take it easy for the rest of today and you should NOT DRIVE or use heavy machinery until tomorrow (because of the sedation medicines used during the test).    FOLLOW UP: Our staff will call the number listed on your records 48-72 hours following your procedure to check on you and address any questions or concerns that you may have regarding the information given to you following your procedure. If we do not reach you, we will leave a message.  We will attempt to reach you two times.  During this call, we will ask if you have developed any symptoms of COVID 19. If you develop any symptoms (ie: fever, flu-like symptoms, shortness of breath, cough etc.) before then, please call [phone].  If you test positive for Covid 19 in the 2 weeks post procedure, please call and report this information to [place].    If any biopsies were taken you will be contacted by phone or by letter within the next 1-3 weeks.  Please call us at [phone] if you have  not heard about the biopsies in 3 weeks.    SIGNATURES/CONFIDENTIALITY: You and/or your care partner have signed paperwork which will be entered into your electronic medical record.  These signatures attest to the fact that that the information above on your After Visit Summary has been reviewed and is understood.  Full responsibility of the confidentiality of this discharge information lies with you and/or your care-partner.

## 2019-12-25 NOTE — Telephone Encounter (Signed)
Pt is scheduled to see Dr. Silverio Decamp at 1330 today. Pt called stating that he forgot he was supposed to start his prep at 0730 this morning. He was wondering if he could start now, and come in for his procedure. After talking with Dr. Silverio Decamp, advised patient that if he could tolerate the Sutab then to try to get the tablets and additional water in no later than 1100. Patient verbalized understanding, and instructed to call office back if any concerns arise.

## 2019-12-25 NOTE — Progress Notes (Signed)
Delayed discharge - meds ordered, and instructions reviewed with pt. Re: meds. Prior to D/C.

## 2019-12-25 NOTE — Progress Notes (Signed)
Pt's states no medical or surgical changes since previsit or office visit.  VS CW

## 2019-12-25 NOTE — Progress Notes (Signed)
PT taken to PACU. Monitors in place. VSS. Report given to RN. 

## 2019-12-25 NOTE — Progress Notes (Signed)
Called to room to assist during endoscopic procedure.  Patient ID and intended procedure confirmed with present staff. Received instructions for my participation in the procedure from the performing physician.  

## 2019-12-29 ENCOUNTER — Telehealth: Payer: Self-pay

## 2019-12-29 NOTE — Telephone Encounter (Signed)
  Follow up Call-  Call back number 12/25/2019  Post procedure Call Back phone  # [phone]  Permission to leave phone message Yes  Some recent data might be hidden     Patient questions:  Do you have a fever, pain , or abdominal swelling? No. Pain Score  0 *  Have you tolerated food without any problems? Yes.    Have you been able to return to your normal activities? Yes.    Do you have any questions about your discharge instructions: Diet   No. Medications  No. Follow up visit  No.  Do you have questions or concerns about your Care? No.  Actions: * If pain score is 4 or above: No action needed, pain <4.  Pt asked when his f/u appoint was.  I advised him the office should call him to make a f/u for 6-8 weeks.  If he has not hurd from the office by Thursday, to give them a call and schedule an appointment.  I asked if he was taking his meds and he replied, "yes, I am". Maw  1. Have you developed a fever since your procedure? no  2.   Have you had an respiratory symptoms (SOB or cough) since your procedure? no  3.   Have you tested positive for COVID 19 since your procedure no  4.   Have you had any family members/close contacts diagnosed with the COVID 19 since your procedure?  no   If yes to any of these questions please route to Joylene John, RN and Erenest Rasher, RN

## 2020-01-05 ENCOUNTER — Encounter: Payer: Self-pay | Admitting: Gastroenterology

## 2020-01-13 ENCOUNTER — Encounter: Payer: Self-pay | Admitting: Gastroenterology

## 2020-01-15 ENCOUNTER — Other Ambulatory Visit: Payer: Self-pay | Admitting: Gastroenterology

## 2020-02-03 ENCOUNTER — Other Ambulatory Visit (INDEPENDENT_AMBULATORY_CARE_PROVIDER_SITE_OTHER): Payer: 59

## 2020-02-03 ENCOUNTER — Encounter: Payer: Self-pay | Admitting: Physician Assistant

## 2020-02-03 ENCOUNTER — Ambulatory Visit (INDEPENDENT_AMBULATORY_CARE_PROVIDER_SITE_OTHER): Payer: 59 | Admitting: Physician Assistant

## 2020-02-03 VITALS — BP 116/60 | HR 76 | Ht 68.0 in | Wt 170.0 lb

## 2020-02-03 DIAGNOSIS — K50819 Crohn's disease of both small and large intestine with unspecified complications: Secondary | ICD-10-CM

## 2020-02-03 LAB — SEDIMENTATION RATE: Sed Rate: 3 mm/hr (ref 0–15)

## 2020-02-03 LAB — C-REACTIVE PROTEIN: CRP: <1 mg/dL (ref 0.5–20.0)

## 2020-02-03 MED ORDER — BUDESONIDE 3 MG PO CPEP
3.0000 mg | ORAL_CAPSULE | Freq: Every morning | ORAL | 3 refills | Status: DC
Start: 2020-02-03 — End: 2020-03-08

## 2020-02-03 NOTE — Patient Instructions (Addendum)
If you are age 42 or older, your body mass index should be between 23-30. Your Body mass index is 25.85 kg/m. If this is out of the aforementioned range listed, please consider follow up with your Primary Care Provider.  If you are age 45 or younger, your body mass index should be between 19-25. Your Body mass index is 25.85 kg/m. If this is out of the aformentioned range listed, please consider follow up with your Primary Care Provider.   Your provider has requested that you go to the basement level for lab work before leaving today. Press "B" on the elevator. The lab is located at the first door on the left as you exit the elevator.  START Budesonide 3 mg, 3 capsules very morning. This has been sent to your pharmacy.  You can stop your Lialda once you have started Budesonide, or have completed what you have left of the Lialda.  Call the office if you have any issues.  You have been scheduled to follow up with Dr. Silverio Decamp on March 24, 2020 at 11:00 am.

## 2020-02-04 ENCOUNTER — Encounter: Payer: Self-pay | Admitting: Physician Assistant

## 2020-02-04 NOTE — Progress Notes (Signed)
Subjective:    Patient ID: John Velez, male    DOB: 1975/11/24, 44 y.o.   MRN: [id number]  HPI John Velez is a 44 year old male, recently established with Dr. Silverio Decamp.  He comes in today for follow-up.  He was initially seen in July 2021 by Ellouise Newer, PA-C for complaints of rectal bleeding.  He underwent colonoscopy on 12/25/2019 with finding of a few small ulcers in the terminal ileum and a continuous area of bleeding and ulcerated mucosa in the rectum, the remainder of the colon was normal.  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  Biopsies from the remainder of the colon were negative.  He was started on Lialda 1.2 g p.o. twice daily, and started on a course of prednisone 20 mg daily x2 weeks then tapered by 5 mg. Comes in today stating that he has been feeling well.  He is no longer seeing any blood with his bowel movements.  He has no complaints of rectal pain or discomfort, no abdominal pain cramping or discomfort.  He has completed the prednisone taper.  He says he does not think he will be able to stay on Lialda because it is $140 per month even with his co-pay.  He is asking if there is any other medication that we would substitute. Last labs with CBC done 11/30/2019 hemoglobin 14.7, WBC of 7.5.  He also had TB skin test done at that time which was negative.  Review of Systems Pertinent positive and negative review of systems were noted in the above HPI section.  All other review of systems was otherwise negative.  Outpatient Encounter Medications as of 02/03/2020  Medication Sig   aspirin EC 81 MG tablet Take 81 mg by mouth daily.   cetirizine (ZYRTEC) 10 MG tablet Take 10 mg by mouth daily.   Cholecalciferol (VITAMIN D PO) Take 10,000 Int'l Units by mouth daily.    Cinnamon 500 MG capsule Take 500 mg by mouth daily.   Cyanocobalamin (VITAMIN B 12 PO) Take by mouth. Takes 1 tablet SL daily.   fluticasone (FLONASE) 50 MCG/ACT  nasal spray Place into both nostrils as needed.    mesalamine (CANASA) 1000 MG suppository Place 1 suppository (1,000 mg total) rectally at bedtime. For 2 weeks   mesalamine (LIALDA) 1.2 g EC tablet Take 1 tablet (1.2 g total) by mouth daily with breakfast. 2 tablets daily by mouth.   montelukast (SINGULAIR) 10 MG tablet Take 1 tablet daily for Allergies   predniSONE (DELTASONE) 10 MG tablet TAKE 2 TABS DAILY WITH BREAKFAST X2WEEKS, 1.5 TABS X2 WEEKS, 1 TAB X2WEEKS, 1/2 TAB X2WEEKS   Zinc 50 MG CAPS Take 1 capsule by mouth daily.   budesonide (ENTOCORT EC) 3 MG 24 hr capsule Take 1 capsule (3 mg total) by mouth in the morning.   [DISCONTINUED] pantoprazole (PROTONIX) 40 MG tablet Take 1 tablet Daily for Indigestion & Acid Reflux (Patient not taking: Reported on 12/25/2019)   [DISCONTINUED] predniSONE (DELTASONE) 5 MG tablet Take 1 tablet (5 mg total) by mouth daily with breakfast. Take 2 tablets [20 mg total) daily by mouth with breakfast for 2 weeks followed by 15 mg daily for 2 weeks, then 10 mg daily for 2 weeks and 5 mg daily for 2 weeks and stop   No facility-administered encounter medications on file as of 02/03/2020.   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   Medication management  07/06/2014   Testosterone deficiency 08/28/2013   Hyperlipidemia (LDL Chol) 08/28/2013   Vitamin D deficiency    Prediabetes    Social History   Socioeconomic History   Marital status: Married    Spouse name: Not on file   Number of children: Not on file   Years of education: Not on file   Highest education level: Not on file  Occupational History   Not on file  Tobacco Use   Smoking status: Never Smoker   Smokeless tobacco: Never Used  Vaping Use   Vaping Use: Former   Substances: CBD  Substance and Sexual Activity   Alcohol use: Yes    Comment: ocassional   Drug use: No   Sexual activity: Not on file    Other Topics Concern   Not on file  Social History Narrative   Not on file   Social Determinants of Health   Financial Resource Strain:    Difficulty of Paying Living Expenses: Not on file  Food Insecurity:    Worried About [place] in the Last Year: Not on file   [place] in the Last Year: Not on file  Transportation Needs:    Lack of Transportation (Medical): Not on file   Lack of Transportation (Non-Medical): Not on file  Physical Activity:    Days of Exercise per Week: Not on file   Minutes of Exercise per Session: Not on file  Stress:    Feeling of Stress : Not on file  Social Connections:    Frequency of Communication with Friends and Family: Not on file   Frequency of Social Gatherings with Friends and Family: Not on file   Attends Religious Services: Not on file   Active Member of Clubs or Organizations: Not on file   Attends Archivist Meetings: Not on file   Marital Status: Not on file  Intimate Partner Violence:    Fear of Current or Ex-Partner: Not on file   Emotionally Abused: Not on file   Physically Abused: Not on file   Sexually Abused: Not on file    John Velez family history includes Cancer in his father, maternal grandfather, maternal grandmother, and mother; Diabetes in his mother; Hyperlipidemia in his father; Hypertension in his father.      Objective:    Vitals:   02/03/20 1407  BP: 116/60  Pulse: 76    Physical Exam Well-developed well-nourished male no acute distress.  Height, Weight,170 BMI 25.8  HEENT; nontraumatic normocephalic, EOMI, PER R LA, sclera anicteric. Oropharynx;no done Neck; supple, no JVD Cardiovascular; regular rate and rhythm with S1-S2, no murmur rub or gallop Pulmonary; Clear bilaterally Abdomen; soft, nontender, nondistended, no palpable mass or hepatosplenomegaly, bowel sounds are active Rectal; not done today Skin; benign exam, no jaundice rash or appreciable  lesions Extremities; no clubbing cyanosis or edema skin warm and dry Neuro/Psych; alert and oriented x4, grossly nonfocal mood and affect appropriate       Assessment & Plan:   #79 44 year old male with new diagnosis of Crohn's disease with mild ileitis and proctitis.  He is currently asymptomatic after completing a course of steroids, and on low-dose Lialda.  Plan We discussed general management of IBD with maintenance medications.  Discussed likelihood of requiring immunosuppressive's in the future. We will check hepatitis serologies, sed rate, CRP, and TPMT enzymes. As Lialda will be prohibitively expensive, and patient also has ileitis will stop Lialda after current prescription and have sent new prescription  for budesonide 9 mg p.o. daily.  We will plan 8-week course then decrease to 6 mg daily, which can be gradually tapered. Consider azathioprine or mercaptopurine. Plan follow-up with Dr. Silverio Decamp  in 2 months.  Patient knows to call should he have any exacerbation of symptoms in the interim.  John Velez S Eleina Jergens PA-C 02/04/2020   Cc: Unk Pinto, MD

## 2020-02-05 NOTE — Progress Notes (Signed)
Reviewed and agree with documentation and assessment and plan. K. Veena Renley Banwart , MD   

## 2020-02-12 LAB — HEPATITIS C ANTIBODY
Hepatitis C Ab: NONREACTIVE
SIGNAL TO CUT-OFF: 0.01 (ref <1.00)

## 2020-02-12 LAB — THIOPURINE METHYLTRANSFERASE (TPMT), RBC: Thiopurine Methyltransferase, RBC: 14 nmol/hr/mL RBC

## 2020-02-12 LAB — HEPATITIS B SURFACE ANTIGEN: Hepatitis B Surface Ag: NONREACTIVE

## 2020-02-12 LAB — HEPATITIS B SURFACE ANTIBODY,QUALITATIVE: Hep B S Ab: NONREACTIVE

## 2020-03-08 MED ORDER — PREDNISONE 10 MG PO TABS: ORAL_TABLET | ORAL | 1 refills | Status: DC

## 2020-03-08 MED ORDER — BUDESONIDE 3 MG PO CPEP: 3.0000 mg | ORAL_CAPSULE | Freq: Every morning | ORAL | 3 refills | Status: DC

## 2020-03-10 MED ORDER — BUDESONIDE 3 MG PO CPEP: 3.0000 mg | ORAL_CAPSULE | Freq: Every morning | ORAL | 3 refills | Status: DC

## 2020-03-10 NOTE — Addendum Note (Signed)
Addended by: Aleatha Borer on: 03/10/2020 03:38 PM   Modules accepted: Orders

## 2020-03-24 ENCOUNTER — Ambulatory Visit (INDEPENDENT_AMBULATORY_CARE_PROVIDER_SITE_OTHER): Payer: 59 | Admitting: Gastroenterology

## 2020-03-24 ENCOUNTER — Other Ambulatory Visit (INDEPENDENT_AMBULATORY_CARE_PROVIDER_SITE_OTHER): Payer: 59

## 2020-03-24 ENCOUNTER — Encounter: Payer: Self-pay | Admitting: Gastroenterology

## 2020-03-24 VITALS — BP 124/74 | HR 78 | Ht 68.5 in | Wt 177.4 lb

## 2020-03-24 DIAGNOSIS — K5080 Crohn's disease of both small and large intestine without complications: Secondary | ICD-10-CM

## 2020-03-24 DIAGNOSIS — Z23 Encounter for immunization: Secondary | ICD-10-CM | POA: Diagnosis not present

## 2020-03-24 LAB — COMPREHENSIVE METABOLIC PANEL
ALT: 15 U/L (ref 0–53)
AST: 20 U/L (ref 0–37)
Albumin: 4.6 g/dL (ref 3.5–5.2)
Alkaline Phosphatase: 71 U/L (ref 39–117)
BUN: 12 mg/dL (ref 6–23)
CO2: 29 mEq/L (ref 19–32)
Calcium: 9.4 mg/dL (ref 8.4–10.5)
Chloride: 103 mEq/L (ref 96–112)
Creatinine, Ser: 1.04 mg/dL (ref 0.40–1.50)
GFR: 87.78 mL/min (ref >60.00)
Glucose, Bld: 97 mg/dL (ref 70–99)
Potassium: 3.7 mEq/L (ref 3.5–5.1)
Sodium: 140 mEq/L (ref 135–145)
Total Bilirubin: 0.4 mg/dL (ref 0.2–1.2)
Total Protein: 7.3 g/dL (ref 6.0–8.3)

## 2020-03-24 LAB — CBC WITH DIFFERENTIAL/PLATELET
Basophils Absolute: 0.1 10*3/uL (ref 0.0–0.1)
Basophils Relative: 1.1 % (ref 0.0–3.0)
Eosinophils Absolute: 0.1 10*3/uL (ref 0.0–0.7)
Eosinophils Relative: 0.8 % (ref 0.0–5.0)
HCT: 40.9 % (ref 39.0–52.0)
Hemoglobin: 13.6 g/dL (ref 13.0–17.0)
Lymphocytes Relative: 17.5 % (ref 12.0–46.0)
Lymphs Abs: 1.9 10*3/uL (ref 0.7–4.0)
MCHC: 33.3 g/dL (ref 30.0–36.0)
MCV: 86.9 fl (ref 78.0–100.0)
Monocytes Absolute: 0.8 10*3/uL (ref 0.1–1.0)
Monocytes Relative: 7.6 % (ref 3.0–12.0)
Neutro Abs: 8.1 10*3/uL — ABNORMAL HIGH (ref 1.4–7.7)
Neutrophils Relative %: 73 % (ref 43.0–77.0)
Platelets: 309 10*3/uL (ref 150.0–400.0)
RBC: 4.71 Mil/uL (ref 4.22–5.81)
RDW: 13.5 % (ref 11.5–15.5)
WBC: 11.1 10*3/uL — ABNORMAL HIGH (ref 4.0–10.5)

## 2020-03-24 LAB — SEDIMENTATION RATE: Sed Rate: 3 mm/hr (ref 0–15)

## 2020-03-24 MED ORDER — FOLIC ACID 1 MG PO TABS: 1.0000 mg | ORAL_TABLET | Freq: Every day | ORAL | 3 refills | Status: DC

## 2020-03-24 MED ORDER — SULFASALAZINE 500 MG PO TBEC: 1000.0000 mg | DELAYED_RELEASE_TABLET | Freq: Two times a day (BID) | ORAL | 3 refills | Status: DC

## 2020-03-24 NOTE — Patient Instructions (Addendum)
We have given you a Twinrix injection A/B today, you will need to come back in 1 month for your second injection  Your provider has requested that you go to the basement level for lab work before leaving today. Press "B" on the elevator. The lab is located at the first door on the left as you exit the elevator. We have given you your Flu shot today  STOP Mesalamine   We will send sulfasalazine and folic acid to your pharmacy  Finish current prescription of Budesonide  STOP high dose of Vitamin D 10,000U  Take 400IU daily instead or a multivitamin   Follow up in 3 months  Due to recent changes in healthcare laws, you may see the results of your imaging and laboratory studies on MyChart before your provider has had a chance to review them.  We understand that in some cases there may be results that are confusing or concerning to you. Not all laboratory results come back in the same time frame and the provider may be waiting for multiple results in order to interpret others.  Please give [place] 48 hours in order for your provider to thoroughly review all the results before contacting the office for clarification of your results.   I appreciate the  opportunity to care for you  Thank You   Harl Bowie , MD

## 2020-03-24 NOTE — Progress Notes (Signed)
John Velez    [id number]    09-22-1975  Primary Care Physician:McKeown, Gwyndolyn Saxon, MD  Referring Physician: Unk Pinto, MD [address] [place],  [place] [address]   Chief complaint: Crohn's disease  HPI: 44 year old very pleasant gentleman with recent diagnosis of Crohn's disease here for follow-up visit for medication management of Crohn's disease  He is currently taking Budesonide 52m daily.  He completed prednisone taper.  He was also started on Lialda, but he did not tolerate it and had higher out-of-pocket expense associated with it.  Colonoscopy on 12/25/2019 with finding of a few small ulcers in the terminal ileum and a continuous area of bleeding and ulcerated mucosa in the rectum, the remainder of the colon was normal.  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  Biopsies from the remainder of the colon were negative.  He is having formed 1-2 BM daily, denies any rectal bleeding, abdominal pain, bloating, nausea, vomiting, dysphagia, decreased appetite or weight loss  TB skin test negative.  He is not immune to hep a and B  He has a skin rash involving both his palms and also upper chest consistent with eczema, advised patient to follow-up with PMD and dermatology   Outpatient Encounter Medications as of 03/24/2020  Medication Sig  . aspirin EC 81 MG tablet Take 81 mg by mouth daily.  . budesonide (ENTOCORT EC) 3 MG 24 hr capsule Take 1 capsule (3 mg total) by mouth in the morning.  . cetirizine (ZYRTEC) 10 MG tablet Take 10 mg by mouth daily.  . Cholecalciferol (VITAMIN D PO) Take 10,000 Int'l Units by mouth daily.   . Cinnamon 500 MG capsule Take 500 mg by mouth daily.  . Cyanocobalamin (VITAMIN B 12 PO) Take 1 tablet by mouth daily.   . fluticasone (FLONASE) 50 MCG/ACT nasal spray Place into both nostrils as needed.   . mesalamine (LIALDA) 1.2 g EC tablet Take 1 tablet (1.2 g total)  by mouth daily with breakfast. 2 tablets daily by mouth.  . montelukast (SINGULAIR) 10 MG tablet Take 1 tablet daily for Allergies  . Zinc 50 MG CAPS Take 1 capsule by mouth daily.  . [DISCONTINUED] mesalamine (CANASA) 1000 MG suppository Place 1 suppository (1,000 mg total) rectally at bedtime. For 2 weeks  . [DISCONTINUED] predniSONE (DELTASONE) 10 MG tablet TAKE 2 TABS DAILY WITH BREAKFAST X2WEEKS, 1.5 TABS X2 WEEKS, 1 TAB X2WEEKS, 1/2 TAB X2WEEKS   No facility-administered encounter medications on file as of 03/24/2020.    Allergies as of 03/24/2020  . (No Known Allergies)    Past Medical History:  Diagnosis Date  . Prediabetes   . Vitamin D deficiency     Past Surgical History:  Procedure Laterality Date  . NO PAST SURGERIES      Family History  Problem Relation Age of Onset  . Diabetes Mother   . Cancer Mother        Breast  . Hypertension Father   . Hyperlipidemia Father   . Cancer Father        Prostate  . Cancer Maternal Grandmother        lung  . Cancer Maternal Grandfather        lung  . Colon cancer Neg Hx   . Colon polyps Neg Hx   . Esophageal cancer Neg Hx   . Rectal cancer Neg Hx   . Stomach cancer Neg Hx  Social History   Socioeconomic History  . Marital status: Married    Spouse name: Not on file  . Number of children: Not on file  . Years of education: Not on file  . Highest education level: Not on file  Occupational History  . Not on file  Tobacco Use  . Smoking status: Former Smoker    Packs/day: 0.25    Years: 0.50    Pack years: 0.12    Types: Cigarettes    Quit date: 2000    Years since quitting: 21.8  . Smokeless tobacco: Never Used  Vaping Use  . Vaping Use: Former  . Substances: CBD  Substance and Sexual Activity  . Alcohol use: Yes    Comment: ocassional  . Drug use: No  . Sexual activity: Not on file  Other Topics Concern  . Not on file  Social History Narrative  . Not on file   Social Determinants of Health    Financial Resource Strain:   . Difficulty of Paying Living Expenses: Not on file  Food Insecurity:   . Worried About [place] fundraiser in the Last Year: Not on file  . Ran Out of Food in the Last Year: Not on file  Transportation Needs:   . Lack of Transportation (Medical): Not on file  . Lack of Transportation (Non-Medical): Not on file  Physical Activity:   . Days of Exercise per Week: Not on file  . Minutes of Exercise per Session: Not on file  Stress:   . Feeling of Stress : Not on file  Social Connections:   . Frequency of Communication with Friends and Family: Not on file  . Frequency of Social Gatherings with Friends and Family: Not on file  . Attends Religious Services: Not on file  . Active Member of Clubs or Organizations: Not on file  . Attends Archivist Meetings: Not on file  . Marital Status: Not on file  Intimate Partner Violence:   . Fear of Current or Ex-Partner: Not on file  . Emotionally Abused: Not on file  . Physically Abused: Not on file  . Sexually Abused: Not on file      Review of systems: All other review of systems negative except as mentioned in the HPI.   Physical Exam: Vitals:   03/24/20 1059  BP: 124/74  Pulse: 78  SpO2: 98%   Body mass index is 26.58 kg/m. Gen:      No acute distress HEENT:  sclera anicteric Abd:      soft, non-tender; no palpable masses, no distension Ext:    No edema Neuro: alert and oriented x 3 Psych: normal mood and affect  Data Reviewed:  Reviewed labs, radiology imaging, old records and pertinent past GI work up   Assessment and Plan/Recommendations:  44 year old very pleasant gentleman with recent diagnosis of Crohn's disease with ileal involvement and proctitis  He is currently asymptomatic on low-dose budesonide 3 mg daily, continue until he finishes the current prescription We will start sulfasalazine 1 g twice daily to prevent any recurrent flares Add folic acid 1 mg daily Plan to  hold off starting long-term immunosuppressive therapy at this point, will plan to revisit if he has any further flares requiring additional steroid taper  Check CBC, CMP and ESR  He is not immune to hepatitis a and B, will start immunization series Due for flu vaccine, will also give him the flu shot  He has a skin rash involving both his  palms and also upper chest consistent with eczema, advised patient to follow-up with PMD and dermatology  Vitamin D within normal range at high normal in the 70s in June 2021.  He takes 10,000 international units daily.  Advised patient to avoid taking high-dose vitamin D given his levels normal to prevent toxicity Use 400 international units daily instead or multivitamin with added vitamin D  Return 3 to 4 weeks or sooner if needed  This visit required >40 minutes of patient care (this includes precharting, chart review, review of results, face-to-face time used for counseling as well as treatment plan and follow-up. The patient was provided an opportunity to ask questions and all were answered. The patient agreed with the plan and demonstrated an understanding of the instructions.  Damaris Hippo , MD    CC: Unk Pinto, MD

## 2020-03-26 ENCOUNTER — Other Ambulatory Visit: Payer: Self-pay | Admitting: Internal Medicine

## 2020-03-26 DIAGNOSIS — R21 Rash and other nonspecific skin eruption: Secondary | ICD-10-CM

## 2020-04-25 ENCOUNTER — Ambulatory Visit (INDEPENDENT_AMBULATORY_CARE_PROVIDER_SITE_OTHER): Payer: 59 | Admitting: Gastroenterology

## 2020-04-25 DIAGNOSIS — Z23 Encounter for immunization: Secondary | ICD-10-CM | POA: Diagnosis not present

## 2020-04-28 ENCOUNTER — Other Ambulatory Visit: Payer: Self-pay | Admitting: Internal Medicine

## 2020-04-28 DIAGNOSIS — J3089 Other allergic rhinitis: Secondary | ICD-10-CM

## 2020-04-28 MED ORDER — MONTELUKAST SODIUM 10 MG PO TABS: ORAL_TABLET | ORAL | 0 refills | Status: DC

## 2020-05-09 ENCOUNTER — Ambulatory Visit: Payer: 59 | Attending: Internal Medicine

## 2020-05-09 DIAGNOSIS — Z23 Encounter for immunization: Secondary | ICD-10-CM

## 2020-05-09 NOTE — Progress Notes (Signed)
   Covid-19 Vaccination Clinic  Name:  Jabri Blancett    MRN: [id number] DOB: 01-Jun-1976  05/09/2020  Mr. Collingsworth was observed post Covid-19 immunization for 15 minutes without incident. He was provided with Vaccine Information Sheet and instruction to access the V-Safe system.   Mr. Burciaga was instructed to call 911 with any severe reactions post vaccine: Marland Kitchen Difficulty breathing  . Swelling of face and throat  . A fast heartbeat  . A bad rash all over body  . Dizziness and weakness   Immunizations Administered    Name Date Dose VIS Date Route   Pfizer COVID-19 Vaccine 05/09/2020  3:32 PM 0.3 mL 03/30/2020 Intramuscular   Manufacturer: [place]   Lot: [id number]   [place]: [address]-7

## 2020-10-23 ENCOUNTER — Other Ambulatory Visit: Payer: Self-pay

## 2020-10-23 DIAGNOSIS — J3089 Other allergic rhinitis: Secondary | ICD-10-CM

## 2020-10-23 MED ORDER — MONTELUKAST SODIUM 10 MG PO TABS: ORAL_TABLET | ORAL | 3 refills | Status: DC

## 2020-10-27 ENCOUNTER — Other Ambulatory Visit: Payer: Self-pay | Admitting: Gastroenterology

## 2020-11-08 MED ORDER — FOLIC ACID 1 MG PO TABS: 1.0000 mg | ORAL_TABLET | Freq: Every day | ORAL | 1 refills | Status: AC

## 2020-11-16 ENCOUNTER — Encounter: Payer: Managed Care, Other (non HMO) | Admitting: Internal Medicine

## 2021-03-21 ENCOUNTER — Other Ambulatory Visit: Payer: Self-pay | Admitting: Physician Assistant

## 2021-03-22 ENCOUNTER — Other Ambulatory Visit: Payer: Self-pay | Admitting: Physician Assistant

## 2021-04-26 ENCOUNTER — Other Ambulatory Visit: Payer: Self-pay | Admitting: Physician Assistant

## 2021-05-03 ENCOUNTER — Other Ambulatory Visit: Payer: Self-pay | Admitting: Physician Assistant

## 2021-05-03 ENCOUNTER — Telehealth: Payer: Self-pay | Admitting: Gastroenterology

## 2021-05-03 MED ORDER — BUDESONIDE 3 MG PO CPEP: 3.0000 mg | ORAL_CAPSULE | Freq: Every morning | ORAL | 0 refills | Status: DC

## 2021-05-03 NOTE — Telephone Encounter (Signed)
Inbound call from patient, states he is in need of a refill for Budesonide. I scheduled a follow up on 12/7 so he could get his refill. He states that he is going out of town today and needs a few to last him. If you could please advise.

## 2021-05-03 NOTE — Telephone Encounter (Signed)
Refills sent in by Cherylann Parr today, called patient to confirm

## 2021-05-17 ENCOUNTER — Ambulatory Visit: Payer: 59 | Admitting: Gastroenterology

## 2021-05-30 ENCOUNTER — Encounter: Payer: Self-pay | Admitting: Gastroenterology

## 2021-05-30 ENCOUNTER — Ambulatory Visit (INDEPENDENT_AMBULATORY_CARE_PROVIDER_SITE_OTHER): Payer: 59 | Admitting: Gastroenterology

## 2021-05-30 VITALS — BP 104/80 | HR 73 | Ht 69.0 in | Wt 188.0 lb

## 2021-05-30 DIAGNOSIS — Z23 Encounter for immunization: Secondary | ICD-10-CM | POA: Diagnosis not present

## 2021-05-30 DIAGNOSIS — K5080 Crohn's disease of both small and large intestine without complications: Secondary | ICD-10-CM | POA: Diagnosis not present

## 2021-05-30 MED ORDER — SULFASALAZINE 500 MG PO TABS: 1000.0000 mg | ORAL_TABLET | Freq: Two times a day (BID) | ORAL | 1 refills | Status: DC

## 2021-05-30 NOTE — Patient Instructions (Signed)
Finish Budesonide 3 mg.   Start Sulfasalazine 500 mg twice daily ([facility]).  Follow up with Dr. Silverio Decamp on 08/03/21 @ 9:50 am.   If you are age 43 or older, your body mass index should be between 23-30. Your Body mass index is 27.76 kg/m. If this is out of the aforementioned range listed, please consider follow up with your Primary Care Provider.  If you are age 24 or younger, your body mass index should be between 19-25. Your Body mass index is 27.76 kg/m. If this is out of the aformentioned range listed, please consider follow up with your Primary Care Provider.   ________________________________________________________  The [facility] providers would like to encourage you to use [facility] to communicate with providers for non-urgent requests or questions.  Due to long hold times on the telephone, sending your provider a message by [facility] may be a faster and more efficient way to get a response.  Please allow 48 business hours for a response.  Please remember that this is for non-urgent requests.  _______________________________________________________

## 2021-05-30 NOTE — Progress Notes (Addendum)
05/30/2021 John Velez [id number] 12-07-1975   HISTORY OF PRESENT ILLNESS:  This is a pleasant 45 year old male who is a patient of Dr. Woodward Ku and was diagnosed with Crohn's disease on colonoscopy 12/2019 as follows:  Colonoscopy on 12/25/2019 with finding of a few small ulcers in the terminal ileum and a continuous area of bleeding and ulcerated mucosa in the rectum, the remainder of the colon was normal.  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  Biopsies from the remainder of the colon were negative.  Was placed on a prednisone taper and started on Lialda, but did not tolerate the Lialda and it was expensive.  At some point he was also placed on budesonide 3 mg daily.  Then at his last visit here in 03/2020 he was prescribed sulfasalazine 1 g twice daily and folic acid and was to discontinue the Budesonide.  He is now here again for regular follow-up requesting refills of the budesonide.  Apparently he never started the sulfasalazine, but did start the folic acid.  He has continued on the budesonide all this time.  Nonetheless, he feels well.  He is having about 1 formed bowel movement a day.  No rectal bleeding no abdominal pain.  CBC and CMP from August look good.  He did get a flu shot this year.  Past Medical History:  Diagnosis Date   Crohn's colitis ([place])    Prediabetes    Seasonal allergies    Vitamin D deficiency    Past Surgical History:  Procedure Laterality Date   NO PAST SURGERIES      reports that he quit smoking about 22 years ago. His smoking use included cigarettes. He has a 0.13 pack-year smoking history. He has never used smokeless tobacco. He reports current alcohol use. He reports that he does not use drugs. family history includes Cancer in his father, maternal aunt, maternal grandfather, maternal grandmother, and mother; Diabetes in his mother; Hyperlipidemia in his father; Hypertension in his  father. No Known Allergies    Outpatient Encounter Medications as of 05/30/2021  Medication Sig   Black Pepper-Turmeric (TURMERIC COMPLEX/BLACK PEPPER) 3-500 MG CAPS Take 1,500 mg by mouth.   budesonide (ENTOCORT EC) 3 MG 24 hr capsule Take 1 capsule (3 mg total) by mouth every morning. **KEEP APPOINTMENT FOR FURTHER REFILLS**   cetirizine (ZYRTEC) 10 MG tablet Take 10 mg by mouth daily.   Cholecalciferol (VITAMIN D3) 10 MCG (400 UNIT) tablet Take by mouth.   Cinnamon 500 MG capsule Take 500 mg by mouth daily.   cyanocobalamin 1000 MCG tablet Take by mouth.   fluticasone (FLONASE) 50 MCG/ACT nasal spray Place into the nose.   folic acid (FOLVITE) 1 MG tablet Take 1 tablet (1 mg total) by mouth daily.   meloxicam (MOBIC) 15 MG tablet Take 15 mg by mouth every morning.   montelukast (SINGULAIR) 10 MG tablet Take by mouth.   sulfaSALAzine (AZULFIDINE) 500 MG tablet Take 2 tablets (1,000 mg total) by mouth 2 (two) times daily.   triamcinolone cream (KENALOG) 0.1 % Apply topically 2 (two) times daily as needed.   zinc gluconate 50 MG tablet Take by mouth.   aspirin EC 81 MG tablet Take 81 mg by mouth daily. (Patient not taking: Reported on 05/30/2021)   [DISCONTINUED] Cholecalciferol (VITAMIN D PO) Take 10,000 Int'l Units by mouth daily.    [DISCONTINUED] Cyanocobalamin (VITAMIN B 12 PO) Take 1 tablet by mouth daily.    [  DISCONTINUED] fluticasone (FLONASE) 50 MCG/ACT nasal spray Place into both nostrils as needed.    [DISCONTINUED] mesalamine (LIALDA) 1.2 g EC tablet Take 1 tablet (1.2 g total) by mouth daily with breakfast. 2 tablets daily by mouth. (Patient not taking: Reported on 05/30/2021)   [DISCONTINUED] montelukast (SINGULAIR) 10 MG tablet Take  1 tablet  Daily  for Allergies   [DISCONTINUED] sulfaSALAzine (AZULFIDINE) 500 MG EC tablet Take 2 tablets (1,000 mg total) by mouth 2 (two) times daily.   [DISCONTINUED] Zinc 50 MG CAPS Take 1 capsule by mouth daily.   No facility-administered  encounter medications on file as of 05/30/2021.     REVIEW OF SYSTEMS  : All other systems reviewed and negative except where noted in the History of Present Illness.   PHYSICAL EXAM: BP 104/80    Pulse 73    Ht 5' 9"  (1.753 m)    Wt 188 lb (85.3 kg)    SpO2 96%    BMI 27.76 kg/m  General: Well developed white male in no acute distress Head: Normocephalic and atraumatic Eyes:  Sclerae anicteric, conjunctiva pink. Ears: Normal auditory acuity Lungs: Clear throughout to auscultation; no W/R/R. Heart: Regular rate and rhythm; no M/R/G. Abdomen: Soft, non-distended.  BS present.  Non-tender. Musculoskeletal: Symmetrical with no gross deformities  Skin: No lesions on visible extremities Extremities: No edema  Neurological: Alert oriented x 4, grossly non-focal Psychological:  Alert and cooperative. Normal mood and affect  ASSESSMENT AND PLAN: *Crohn's disease with ileal involvement and proctitis: Previously Lialda was not well-tolerated and was expensive.  October 2021 he was supposed to start on sulfasalazine 1 g twice daily along with folic acid.  Does not appear that he ever started the sulfasalazine, but he is taking the folic acid.  He has continued on budesonide 3 mg daily, which was supposed to be discontinued.  We will plan to start sulfasalazine at that dose and continue folic acid.  Prescription sent to the pharmacy.  He will complete the last week that he has with the budesonide and then discontinue that for now.  No labs today as his last back in August looked good.  He will follow-up with Dr. Silverio Decamp in February at which time she can have labs performed.  He had started his hepatitis a and B vaccinations, but never completed the series.  We will give him his last vaccines today.  Question if she will want a check titers at his next blood draw as well.   CC:  Unk Pinto, MD

## 2021-06-28 NOTE — Progress Notes (Signed)
Reviewed and agree with documentation and assessment and plan. K. Veena Sheddrick Lattanzio , MD   

## 2021-08-03 ENCOUNTER — Ambulatory Visit: Payer: 59 | Admitting: Gastroenterology

## 2021-09-29 ENCOUNTER — Encounter: Payer: Self-pay | Admitting: Gastroenterology

## 2021-09-29 ENCOUNTER — Ambulatory Visit (INDEPENDENT_AMBULATORY_CARE_PROVIDER_SITE_OTHER): Payer: Commercial Managed Care - PPO | Admitting: Gastroenterology

## 2021-09-29 ENCOUNTER — Other Ambulatory Visit (INDEPENDENT_AMBULATORY_CARE_PROVIDER_SITE_OTHER): Payer: Commercial Managed Care - PPO

## 2021-09-29 VITALS — BP 110/70 | HR 76 | Ht 69.0 in | Wt 177.8 lb

## 2021-09-29 DIAGNOSIS — K5080 Crohn's disease of both small and large intestine without complications: Secondary | ICD-10-CM

## 2021-09-29 LAB — COMPREHENSIVE METABOLIC PANEL
ALT: 21 U/L (ref 0–53)
AST: 23 U/L (ref 0–37)
Albumin: 5 g/dL (ref 3.5–5.2)
Alkaline Phosphatase: 66 U/L (ref 39–117)
BUN: 11 mg/dL (ref 6–23)
CO2: 29 mEq/L (ref 19–32)
Calcium: 10.1 mg/dL (ref 8.4–10.5)
Chloride: 103 mEq/L (ref 96–112)
Creatinine, Ser: 1.07 mg/dL (ref 0.40–1.50)
GFR: 84.64 mL/min (ref >60.00)
Glucose, Bld: 94 mg/dL (ref 70–99)
Potassium: 4.4 mEq/L (ref 3.5–5.1)
Sodium: 141 mEq/L (ref 135–145)
Total Bilirubin: 0.5 mg/dL (ref 0.2–1.2)
Total Protein: 7.6 g/dL (ref 6.0–8.3)

## 2021-09-29 LAB — CBC WITH DIFFERENTIAL/PLATELET
Basophils Absolute: 0.1 10*3/uL (ref 0.0–0.1)
Basophils Relative: 1 % (ref 0.0–3.0)
Eosinophils Absolute: 0.3 10*3/uL (ref 0.0–0.7)
Eosinophils Relative: 4.4 % (ref 0.0–5.0)
HCT: 47.5 % (ref 39.0–52.0)
Hemoglobin: 15.6 g/dL (ref 13.0–17.0)
Lymphocytes Relative: 23.1 % (ref 12.0–46.0)
Lymphs Abs: 1.8 10*3/uL (ref 0.7–4.0)
MCHC: 32.9 g/dL (ref 30.0–36.0)
MCV: 89.8 fl (ref 78.0–100.0)
Monocytes Absolute: 0.7 10*3/uL (ref 0.1–1.0)
Monocytes Relative: 8.8 % (ref 3.0–12.0)
Neutro Abs: 4.9 10*3/uL (ref 1.4–7.7)
Neutrophils Relative %: 62.7 % (ref 43.0–77.0)
Platelets: 308 10*3/uL (ref 150.0–400.0)
RBC: 5.29 Mil/uL (ref 4.22–5.81)
RDW: 13.3 % (ref 11.5–15.5)
WBC: 7.8 10*3/uL (ref 4.0–10.5)

## 2021-09-29 LAB — FERRITIN: Ferritin: 29.8 ng/mL (ref 22.0–322.0)

## 2021-09-29 LAB — B12 AND FOLATE PANEL
Folate: >23.5 ng/mL (ref >5.9)
Vitamin B-12: 581 pg/mL (ref 211–911)

## 2021-09-29 LAB — HIGH SENSITIVITY CRP: CRP, High Sensitivity: 0.47 mg/L (ref 0.000–5.000)

## 2021-09-29 LAB — SEDIMENTATION RATE: Sed Rate: 1 mm/hr (ref 0–15)

## 2021-09-29 LAB — VITAMIN D 25 HYDROXY (VIT D DEFICIENCY, FRACTURES): VITD: 62.7 ng/mL (ref 30.00–100.00)

## 2021-09-29 NOTE — Patient Instructions (Addendum)
Your provider has requested that you go to the basement level for lab work before leaving today. Press "B" on the elevator. The lab is located at the first door on the left as you exit the elevator.  ? ?Continue Sulfasalazine and Folic Acid ? ?Continue daily multivitamin  ? ?Due to recent changes in healthcare laws, you may see the results of your imaging and laboratory studies on MyChart before your provider has had a chance to review them.  We understand that in some cases there may be results that are confusing or concerning to you. Not all laboratory results come back in the same time frame and the provider may be waiting for multiple results in order to interpret others.  Please give [place] 48 hours in order for your provider to thoroughly review all the results before contacting the office for clarification of your results.   ? ?If you are age 26 or older, your body mass index should be between 23-30. Your Body mass index is 26.26 kg/m?Marland Kitchen If this is out of the aforementioned range listed, please consider follow up with your Primary Care Provider. ? ?If you are age 78 or younger, your body mass index should be between 19-25. Your Body mass index is 26.26 kg/m?Marland Kitchen If this is out of the aformentioned range listed, please consider follow up with your Primary Care Provider.  ? ?________________________________________________________ ? ?The  GI providers would like to encourage you to use [facility] to communicate with providers for non-urgent requests or questions.  Due to long hold times on the telephone, sending your provider a message by [facility] may be a faster and more efficient way to get a response.  Please allow 48 business hours for a response.  Please remember that this is for non-urgent requests.  ?_______________________________________________________  ? ?I appreciate the  opportunity to care for you ? ?Thank You  ? ?Harl Bowie , MD  ?

## 2021-09-29 NOTE — Progress Notes (Signed)
? ?       ? ?John Velez    [id number]    23-Sep-1975 ? ?Primary Care Physician:Linthavong, Lucianne Muss, MD ? ?Referring Physician: Unk Pinto, MD ?[address] ?[address] ?[place],  [place] [address] ? ? ?Chief complaint:  Crohn's disease ? ?HPI: ? ?46 year old very pleasant gentleman with  diagnosis of Crohn's disease here for follow-up visit  ? ?He is overall doing well and has no specific complaints ?Denies any nausea, vomiting, abdominal pain, melena or bright red blood per rectum ?Tolerating sulfasalazine well.  He is taking folic acid along with that ?  ?Colonoscopy on 12/25/2019 with finding of a few small ulcers in the terminal ileum and a continuous area of bleeding and ulcerated mucosa in the rectum, the remainder of the colon was normal.  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  Biopsies from the remainder of the colon were negative. ?  ? ? ?Outpatient Encounter Medications as of 09/29/2021  ?Medication Sig  ? aspirin EC 81 MG tablet Take 81 mg by mouth daily.  ? Black Pepper-Turmeric 3-500 MG CAPS Take 1,500 mg by mouth.  ? budesonide (ENTOCORT EC) 3 MG 24 hr capsule Take 1 capsule (3 mg total) by mouth every morning. **KEEP APPOINTMENT FOR FURTHER REFILLS**  ? cetirizine (ZYRTEC) 10 MG tablet Take 10 mg by mouth daily.  ? Cholecalciferol (VITAMIN D3) 10 MCG (400 UNIT) tablet Take by mouth.  ? Cinnamon 500 MG capsule Take 500 mg by mouth daily.  ? cyanocobalamin 1000 MCG tablet Take by mouth.  ? fluticasone (FLONASE) 50 MCG/ACT nasal spray Place into the nose.  ? folic acid (FOLVITE) 1 MG tablet Take 1 tablet (1 mg total) by mouth daily.  ? meloxicam (MOBIC) 15 MG tablet Take 15 mg by mouth every morning.  ? montelukast (SINGULAIR) 10 MG tablet Take 10 mg by mouth daily as needed.  ? sulfaSALAzine (AZULFIDINE) 500 MG tablet Take 2 tablets (1,000 mg total) by mouth 2 (two) times daily.  ? triamcinolone cream (KENALOG) 0.1 % Apply topically 2  (two) times daily as needed.  ? zinc gluconate 50 MG tablet Take by mouth.  ? ?No facility-administered encounter medications on file as of 09/29/2021.  ? ? ?Allergies as of 09/29/2021  ? (No Known Allergies)  ? ? ?Past Medical History:  ?Diagnosis Date  ? Crohn's colitis ([place])   ? Prediabetes   ? Seasonal allergies   ? Vitamin D deficiency   ? ? ?Past Surgical History:  ?Procedure Laterality Date  ? NO PAST SURGERIES    ? ? ?Family History  ?Problem Relation Age of Onset  ? Diabetes Mother   ? Cancer Mother   ?     Breast  ? Hypertension Father   ? Hyperlipidemia Father   ? Cancer Father   ?     Prostate  ? Cancer Maternal Grandmother   ?     lung  ? Cancer Maternal Grandfather   ?     lung  ? Cancer Maternal Aunt   ?     lymph nodes, breast...  ? Colon cancer Neg Hx   ? Colon polyps Neg Hx   ? Esophageal cancer Neg Hx   ? Rectal cancer Neg Hx   ? Stomach cancer Neg Hx   ? ? ?Social History  ? ?Socioeconomic History  ? Marital status: Married  ?  Spouse name: Not on file  ? Number of children: Not on file  ?  Years of education: Not on file  ? Highest education level: Not on file  ?Occupational History  ? Not on file  ?Tobacco Use  ? Smoking status: Former  ?  Packs/day: 0.25  ?  Years: 0.50  ?  Pack years: 0.13  ?  Types: Cigarettes  ?  Quit date: 2000  ?  Years since quitting: 23.3  ? Smokeless tobacco: Never  ?Vaping Use  ? Vaping Use: Former  ? Substances: CBD  ?Substance and Sexual Activity  ? Alcohol use: Yes  ?  Comment: ocassional  ? Drug use: No  ? Sexual activity: Not on file  ?Other Topics Concern  ? Not on file  ?Social History Narrative  ? Not on file  ? ?Social Determinants of Health  ? ?Financial Resource Strain: Not on file  ?Food Insecurity: Not on file  ?Transportation Needs: Not on file  ?Physical Activity: Not on file  ?Stress: Not on file  ?Social Connections: Not on file  ?Intimate Partner Violence: Not on file  ? ? ? ? ?Review of systems: ?All other review of systems negative except as  mentioned in the HPI. ? ? ?Physical Exam: ?Vitals:  ? 09/29/21 0953  ?BP: 110/70  ?Pulse: 76  ? ?Body mass index is 26.26 kg/m?. ?Gen:      No acute distress ?HEENT:  sclera anicteric ?Abd:      soft, non-tender; no palpable masses, no distension ?Ext:    No edema ?Neuro: alert and oriented x 3 ?Psych: normal mood and affect ? ?Data Reviewed: ? ?Reviewed labs, radiology imaging, old records and pertinent past GI work up ? ? ?Assessment and Plan/Recommendations: ? ?46 year old very pleasant gentleman with Crohn's disease of both small and large intestine without complications ?He is in clinical remission on sulfasalazine 1 g twice daily, continue current dose ?Advised patient to take 1 mg folic acid along with that to prevent deficiency ?Use daily multivitamin ?Follow-up CBC, CMP, iron panel, B12, folate, vitamin D, ESR and CRP for IBD health maintenance ? ?Return in 1 year or sooner if needed ? ?The patient was provided an opportunity to ask questions and all were answered. The patient agreed with the plan and demonstrated an understanding of the instructions. ? ?K. Denzil Magnuson , MD ?  ? ?CC: Unk Pinto, MD ? ? ?

## 2021-10-02 ENCOUNTER — Encounter: Payer: Self-pay | Admitting: Gastroenterology

## 2021-11-16 ENCOUNTER — Encounter: Payer: 59 | Admitting: Internal Medicine

## 2022-03-18 ENCOUNTER — Other Ambulatory Visit: Payer: Self-pay | Admitting: Gastroenterology

## 2022-03-19 ENCOUNTER — Other Ambulatory Visit: Payer: Self-pay | Admitting: Gastroenterology

## 2022-03-19 MED ORDER — SULFASALAZINE 500 MG PO TABS: 1000.0000 mg | ORAL_TABLET | Freq: Two times a day (BID) | ORAL | 0 refills | Status: DC

## 2023-01-03 ENCOUNTER — Other Ambulatory Visit: Payer: Self-pay | Admitting: Gastroenterology

## 2023-01-03 ENCOUNTER — Encounter: Payer: Self-pay | Admitting: *Deleted

## 2023-01-29 ENCOUNTER — Other Ambulatory Visit: Payer: Self-pay | Admitting: Family Medicine

## 2023-01-29 DIAGNOSIS — Z136 Encounter for screening for cardiovascular disorders: Secondary | ICD-10-CM

## 2023-02-12 ENCOUNTER — Other Ambulatory Visit: Payer: Commercial Managed Care - PPO

## 2023-04-17 ENCOUNTER — Other Ambulatory Visit: Payer: Self-pay | Admitting: Gastroenterology

## 2023-04-18 MED ORDER — SULFASALAZINE 500 MG PO TABS: 1000.0000 mg | ORAL_TABLET | Freq: Two times a day (BID) | ORAL | 0 refills | Status: DC

## 2023-05-11 ENCOUNTER — Encounter: Payer: Self-pay | Admitting: Gastroenterology

## 2023-05-15 ENCOUNTER — Other Ambulatory Visit (INDEPENDENT_AMBULATORY_CARE_PROVIDER_SITE_OTHER): Payer: Commercial Managed Care - PPO

## 2023-05-15 ENCOUNTER — Ambulatory Visit (INDEPENDENT_AMBULATORY_CARE_PROVIDER_SITE_OTHER): Payer: Commercial Managed Care - PPO | Admitting: Gastroenterology

## 2023-05-15 ENCOUNTER — Encounter: Payer: Self-pay | Admitting: Gastroenterology

## 2023-05-15 ENCOUNTER — Other Ambulatory Visit: Payer: Self-pay | Admitting: Gastroenterology

## 2023-05-15 VITALS — BP 132/76 | HR 83 | Ht 69.0 in | Wt 172.4 lb

## 2023-05-15 DIAGNOSIS — K5080 Crohn's disease of both small and large intestine without complications: Secondary | ICD-10-CM

## 2023-05-15 LAB — CBC WITH DIFFERENTIAL/PLATELET
Basophils Absolute: 0.1 10*3/uL (ref 0.0–0.1)
Basophils Relative: 0.9 % (ref 0.0–3.0)
Eosinophils Absolute: 0.1 10*3/uL (ref 0.0–0.7)
Eosinophils Relative: 1.6 % (ref 0.0–5.0)
HCT: 45.3 % (ref 39.0–52.0)
Hemoglobin: 15.1 g/dL (ref 13.0–17.0)
Lymphocytes Relative: 14.8 % (ref 12.0–46.0)
Lymphs Abs: 1.2 10*3/uL (ref 0.7–4.0)
MCHC: 33.3 g/dL (ref 30.0–36.0)
MCV: 90.6 fL (ref 78.0–100.0)
Monocytes Absolute: 0.7 10*3/uL (ref 0.1–1.0)
Monocytes Relative: 8.3 % (ref 3.0–12.0)
Neutro Abs: 6.1 10*3/uL (ref 1.4–7.7)
Neutrophils Relative %: 74.4 % (ref 43.0–77.0)
Platelets: 307 10*3/uL (ref 150.0–400.0)
RBC: 5 Mil/uL (ref 4.22–5.81)
RDW: 13.1 % (ref 11.5–15.5)
WBC: 8.2 10*3/uL (ref 4.0–10.5)

## 2023-05-15 LAB — B12 AND FOLATE PANEL
Folate: >24.2 ng/mL (ref >5.9)
Vitamin B-12: 632 pg/mL (ref 211–911)

## 2023-05-15 LAB — COMPREHENSIVE METABOLIC PANEL
ALT: 29 U/L (ref 0–53)
AST: 36 U/L (ref 0–37)
Albumin: 5 g/dL (ref 3.5–5.2)
Alkaline Phosphatase: 65 U/L (ref 39–117)
BUN: 13 mg/dL (ref 6–23)
CO2: 28 meq/L (ref 19–32)
Calcium: 10 mg/dL (ref 8.4–10.5)
Chloride: 104 meq/L (ref 96–112)
Creatinine, Ser: 1.27 mg/dL (ref 0.40–1.50)
GFR: 68.13 mL/min (ref >60.00)
Glucose, Bld: 99 mg/dL (ref 70–99)
Potassium: 4.3 meq/L (ref 3.5–5.1)
Sodium: 141 meq/L (ref 135–145)
Total Bilirubin: 0.5 mg/dL (ref 0.2–1.2)
Total Protein: 7.5 g/dL (ref 6.0–8.3)

## 2023-05-15 LAB — IBC + FERRITIN
Ferritin: 40.1 ng/mL (ref 22.0–322.0)
Iron: 129 ug/dL (ref 42–165)
Saturation Ratios: 35.3 % (ref 20.0–50.0)
TIBC: 365.4 ug/dL (ref 250.0–450.0)
Transferrin: 261 mg/dL (ref 212.0–360.0)

## 2023-05-15 LAB — VITAMIN D 25 HYDROXY (VIT D DEFICIENCY, FRACTURES): VITD: 68.69 ng/mL (ref 30.00–100.00)

## 2023-05-15 LAB — SEDIMENTATION RATE: Sed Rate: 2 mm/h (ref 0–15)

## 2023-05-15 LAB — HIGH SENSITIVITY CRP: CRP, High Sensitivity: 0.48 mg/L (ref 0.000–5.000)

## 2023-05-15 MED ORDER — SUTAB 1479-225-188 MG PO TABS: 24.0000 | ORAL_TABLET | Freq: Once | ORAL | 0 refills | Status: DC

## 2023-05-15 MED ORDER — SULFASALAZINE 500 MG PO TABS: 1000.0000 mg | ORAL_TABLET | Freq: Two times a day (BID) | ORAL | 3 refills | Status: DC

## 2023-05-15 MED ORDER — SUTAB 1479-225-188 MG PO TABS: 24.0000 | ORAL_TABLET | Freq: Once | ORAL | 0 refills | Status: AC

## 2023-05-15 NOTE — Progress Notes (Signed)
John Velez    [id number]    12/27/1975  Primary Care Physician:Linthavong, Trisha Mangle, MD  Referring Physician: Marisue Ivan, MD [phone] [facility] [address] [facility] [place],  [place] [address]   Chief complaint: Crohn's disease  Discussed the use of AI scribe software for clinical note transcription with the patient, who gave verbal consent to proceed.  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 He reports a recent episode of rectal bleeding, which he attributes to missing several doses of his medication, including sulfasalazine, over a three-day period. The bleeding resolved after two to three days. He has been taking sulfasalazine 1 gram twice daily and folic acid daily. He reports at least one bowel movement per day, with no diarrhea or mucus, and no abdominal pain. He occasionally forgets his nighttime dose of medication, which he believes may contribute to his symptoms. He has not experienced any significant flares of his Crohn's disease, and any stomach issues he has had, he attributes to missed doses of medication. He has recently resumed cold plunges, which he finds helpful. He reports some sensitivity in the left lower abdomen and upper abdomen on examination.       Colonoscopy on 12/25/2019 with finding of a few small ulcers in the terminal ileum and a continuous area of bleeding and ulcerated mucosa in the rectum, the remainder of the colon was normal.  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  Biopsies from the remainder of the colon were negative.    Outpatient Encounter Medications as of 05/15/2023  Medication Sig   aspirin EC 81 MG tablet Take 81 mg by mouth daily.   Black Pepper-Turmeric 3-500 MG CAPS Take 1,500 mg by mouth.   cetirizine (ZYRTEC) 10 MG tablet Take 10 mg by mouth daily.    Cholecalciferol (VITAMIN D3) 10 MCG (400 UNIT) tablet Take by mouth.   Cinnamon 500 MG capsule Take 500 mg by mouth daily.   cyanocobalamin 1000 MCG tablet Take by mouth.   fluticasone (FLONASE) 50 MCG/ACT nasal spray Place into the nose.   folic acid (FOLVITE) 1 MG tablet Take 1 tablet (1 mg total) by mouth daily.   meloxicam (MOBIC) 15 MG tablet Take 15 mg by mouth every morning.   montelukast (SINGULAIR) 10 MG tablet Take 10 mg by mouth daily as needed.   Sodium Sulfate-Mag Sulfate-KCl (SUTAB) [phone] MG TABS Take 24 tablets by mouth once for 1 dose.   Sodium Sulfate-Mag Sulfate-KCl (SUTAB) [phone] MG TABS Take 24 tablets by mouth once for 1 dose.   sulfaSALAzine (AZULFIDINE) 500 MG tablet Take 2 tablets by mouth twice daily   sulfaSALAzine (AZULFIDINE) 500 MG tablet Take 2 tablets (1,000 mg total) by mouth 2 (two) times daily.   triamcinolone cream (KENALOG) 0.1 % Apply topically 2 (two) times daily as needed.   zinc gluconate 50 MG tablet Take by mouth.   [DISCONTINUED] budesonide (ENTOCORT EC) 3 MG 24 hr capsule Take 1 capsule (3 mg total) by mouth every morning. **KEEP APPOINTMENT FOR FURTHER REFILLS** (Patient not taking: Reported on 05/15/2023)   No facility-administered encounter medications on file as of 05/15/2023.    Allergies as of 05/15/2023   (No Known Allergies)    Past Medical History:  Diagnosis Date   Crohn's colitis ([facility])    Prediabetes    Seasonal allergies  Vitamin D deficiency     Past Surgical History:  Procedure Laterality Date   NO PAST SURGERIES      Family History  Problem Relation Age of Onset   Diabetes Mother    Cancer Mother        Breast   Hypertension Father    Hyperlipidemia Father    Cancer Father        Prostate   Cancer Maternal Grandmother        lung   Cancer Maternal Grandfather        lung   Cancer Maternal Aunt        lymph nodes, breast...   Colon cancer Neg Hx    Colon polyps Neg Hx    Esophageal cancer Neg Hx     Rectal cancer Neg Hx    Stomach cancer Neg Hx     Social History   Socioeconomic History   Marital status: Married    Spouse name: Not on file   Number of children: Not on file   Years of education: Not on file   Highest education level: Not on file  Occupational History   Not on file  Tobacco Use   Smoking status: Former    Current packs/day: 0.00    Average packs/day: 0.3 packs/day for 0.5 years (0.1 ttl pk-yrs)    Types: Cigarettes    Start date: 12/1997    Quit date: 2000    Years since quitting: 24.9   Smokeless tobacco: Never  Vaping Use   Vaping status: Former   Substances: CBD  Substance and Sexual Activity   Alcohol use: Yes    Comment: ocassional   Drug use: No   Sexual activity: Not on file  Other Topics Concern   Not on file  Social History Narrative   Not on file   Social Determinants of Health   Financial Resource Strain: Low Risk  (01/28/2023)   Received from [facility]   Overall Financial Resource Strain (CARDIA)    Difficulty of Paying Living Expenses: Not hard at all  Food Insecurity: No Food Insecurity (01/28/2023)   Received from [facility]   Hunger Vital Sign    Worried About Running Out of Food in the Last Year: Never true    Ran Out of Food in the Last Year: Never true  Transportation Needs: No Transportation Needs (01/28/2023)   Received from [facility] - Transportation    In the past 12 months, has lack of transportation kept you from medical appointments or from getting medications?: No    Lack of Transportation (Non-Medical): No  Physical Activity: Not on file  Stress: Not on file  Social Connections: Not on file  Intimate Partner Violence: Not on file      Review of systems: All other review of systems negative except as mentioned in the HPI.   Physical Exam: Vitals:   05/15/23 0844  BP: 132/76  Pulse: 83  SpO2: 96%   Body mass index is 25.46  kg/m. Gen:      No acute distress HEENT:  sclera anicteric CV: s1s2 rrr, no murmur Lungs: B/l clear. Abd:      soft, mild tenderness in right upper quadrant and left lower quadrant on deep palpation; no palpable masses, no distension Ext:    No edema Neuro: alert and oriented x 3 Psych: normal mood and affect  Data Reviewed:  Reviewed labs, radiology imaging, old records and  pertinent past GI work up     Assessment and Plan:    47 year old very pleasant gentleman with Crohn's disease of both small and large intestine without complications He is in clinical remission on sulfasalazine 1 g twice daily, continue current dose Advised patient to take 1 mg folic acid along with that to prevent deficiency Crohn's Disease Mild flare with brief bleeding episode after missing few doses of Sulfasalazine. No current abdominal pain, diarrhea, or mucous. -Consider taking all doses in the morning for better compliance. -Schedule colonoscopy for 06/11/2023 to assess disease activity. The risks and benefits as well as alternatives of endoscopic procedure(s) have been discussed and reviewed. All questions answered. The patient agrees to proceed.  -Order CBC, CMP, ESR, CRP, Iron panel, vitamin D, B12, and Folate levels to assess for anemia and inflammation for IBD health maintenance. Discussed annual flu vaccination  Follow-up in 1 year or sooner if needed.      The patient was provided an opportunity to ask questions and all were answered. The patient agreed with the plan and demonstrated an understanding of the instructions.  Iona Beard , MD    CC: Marisue Ivan, MD

## 2023-05-15 NOTE — Patient Instructions (Addendum)
VISIT SUMMARY:  You had a follow-up appointment today to discuss your Crohn's disease. You mentioned a recent episode of rectal bleeding, which you believe was due to missing several doses of your medication. The bleeding resolved after a few days, and you have not experienced any significant flares of your condition. You are currently taking sulfasalazine and folic acid, and you have resumed cold plunges, which you find helpful.  YOUR PLAN:  -CROHN'S DISEASE: Crohn's disease is a chronic inflammatory condition of the gastrointestinal tract. You experienced a mild flare with brief bleeding after missing several doses of your medication. The plan is to continue taking sulfasalazine 1 gram twice daily. To help with compliance, consider taking all doses in the morning. A colonoscopy is scheduled for June 11, 2023, to assess disease activity. Blood tests (CBC, CMP, ESR, CRP, Iron/Ferritin , B12,Vitamin D and Folate) will be ordered to check for anemia and inflammation.  INSTRUCTIONS:  Please follow up in one year if no issues arise. Make sure to schedule your colonoscopy for June 11, 2023, and complete the blood tests as ordered.  You have been scheduled for a colonoscopy. Please follow written instructions given to you at your visit today.   Please pick up your prep supplies at the pharmacy within the next 1-3 days.  If you use inhalers (even only as needed), please bring them with you on the day of your procedure.  DO NOT TAKE 7 DAYS PRIOR TO TEST- Trulicity (dulaglutide) Ozempic, Wegovy (semaglutide) Mounjaro (tirzepatide) Bydureon Bcise (exanatide extended release)  DO NOT TAKE 1 DAY PRIOR TO YOUR TEST Rybelsus (semaglutide) Adlyxin (lixisenatide) Victoza (liraglutide) Byetta (exanatide) ___________________________________________________________________________   Your provider has requested that you go to the basement level for lab work before leaving today. Press "B" on the  elevator. The lab is located at the first door on the left as you exit the elevator.   Due to recent changes in healthcare laws, you may see the results of your imaging and laboratory studies on MyChart before your provider has had a chance to review them.  We understand that in some cases there may be results that are confusing or concerning to you. Not all laboratory results come back in the same time frame and the provider may be waiting for multiple results in order to interpret others.  Please give [place] 48 hours in order for your provider to thoroughly review all the results before contacting the office for clarification of your results.    I appreciate the  opportunity to care for you  Thank You   Marsa Aris , MD

## 2023-05-15 NOTE — Addendum Note (Signed)
Addended by: Marlowe Kays on: 05/15/2023 09:40 AM   Modules accepted: Orders

## 2023-05-20 ENCOUNTER — Other Ambulatory Visit: Payer: Self-pay

## 2023-05-20 MED ORDER — SUTAB 1479-225-188 MG PO TABS: ORAL_TABLET | ORAL | 0 refills | Status: AC

## 2023-05-20 NOTE — Telephone Encounter (Signed)
Will be surveillance colonoscopy for history of Crohn's disease,, not sure what his insurance considers this as as some insurances automatically convert all surveillance exams to diagnostic

## 2023-06-11 ENCOUNTER — Encounter: Payer: Commercial Managed Care - PPO | Admitting: Gastroenterology

## 2023-07-31 ENCOUNTER — Encounter: Payer: Self-pay | Admitting: Gastroenterology

## 2023-08-09 ENCOUNTER — Encounter: Payer: Commercial Managed Care - PPO | Admitting: Gastroenterology

## 2023-10-26 ENCOUNTER — Other Ambulatory Visit: Payer: Self-pay | Admitting: Gastroenterology

## 2024-01-27 DIAGNOSIS — Z0000 Encounter for general adult medical examination without abnormal findings: Secondary | ICD-10-CM | POA: Diagnosis not present

## 2024-01-27 DIAGNOSIS — Z125 Encounter for screening for malignant neoplasm of prostate: Secondary | ICD-10-CM | POA: Diagnosis not present

## 2024-01-27 DIAGNOSIS — Z131 Encounter for screening for diabetes mellitus: Secondary | ICD-10-CM | POA: Diagnosis not present

## 2024-01-27 DIAGNOSIS — K5010 Crohn's disease of large intestine without complications: Secondary | ICD-10-CM | POA: Diagnosis not present

## 2024-01-27 DIAGNOSIS — Z136 Encounter for screening for cardiovascular disorders: Secondary | ICD-10-CM | POA: Diagnosis not present

## 2024-01-29 ENCOUNTER — Other Ambulatory Visit: Payer: Self-pay | Admitting: Gastroenterology

## 2024-01-29 DIAGNOSIS — Z0000 Encounter for general adult medical examination without abnormal findings: Secondary | ICD-10-CM | POA: Diagnosis not present

## 2024-01-29 DIAGNOSIS — Z1331 Encounter for screening for depression: Secondary | ICD-10-CM | POA: Diagnosis not present

## 2024-01-29 DIAGNOSIS — Z125 Encounter for screening for malignant neoplasm of prostate: Secondary | ICD-10-CM | POA: Diagnosis not present

## 2024-03-30 DIAGNOSIS — Z23 Encounter for immunization: Secondary | ICD-10-CM | POA: Diagnosis not present

## 2024-04-28 ENCOUNTER — Other Ambulatory Visit: Payer: Self-pay | Admitting: Gastroenterology

## 2024-05-25 ENCOUNTER — Other Ambulatory Visit: Payer: Self-pay | Admitting: Gastroenterology

## 2024-06-26 ENCOUNTER — Other Ambulatory Visit: Payer: Self-pay | Admitting: Gastroenterology

## 2024-06-26 NOTE — Telephone Encounter (Signed)
 Must have an office visit

## 2024-08-21 ENCOUNTER — Ambulatory Visit: Admitting: Gastroenterology
